# Patient Record
Sex: Male | Born: 1960 | Race: White | Hispanic: No | Marital: Married | Smoking: Never smoker
Health system: Southern US, Community
[De-identification: ages and names within clinical notes are randomized; demographics above are authoritative.]

## PROBLEM LIST (undated history)

## (undated) ENCOUNTER — Emergency Department (HOSPITAL_COMMUNITY): Disposition: A | Discharge status: Home / Self Care | Payer: Self-pay

## (undated) DIAGNOSIS — I639 Cerebral infarction, unspecified: Secondary | ICD-10-CM

## (undated) DIAGNOSIS — N200 Calculus of kidney: Secondary | ICD-10-CM

## (undated) DIAGNOSIS — K219 Gastro-esophageal reflux disease without esophagitis: Secondary | ICD-10-CM

## (undated) DIAGNOSIS — I10 Essential (primary) hypertension: Secondary | ICD-10-CM

## (undated) DIAGNOSIS — E785 Hyperlipidemia, unspecified: Secondary | ICD-10-CM

## (undated) HISTORY — DX: Hyperlipidemia, unspecified: E78.5

## (undated) HISTORY — DX: Essential (primary) hypertension: I10

## (undated) HISTORY — DX: Calculus of kidney: N20.0

## (undated) HISTORY — DX: Gastro-esophageal reflux disease without esophagitis: K21.9

---

## 1997-09-11 ENCOUNTER — Emergency Department (HOSPITAL_COMMUNITY): Admission: EM | Admit: 1997-09-11 | Discharge: 1997-09-11 | Payer: Self-pay | Admitting: Emergency Medicine

## 2000-10-02 ENCOUNTER — Emergency Department (HOSPITAL_COMMUNITY): Admission: EM | Admit: 2000-10-02 | Discharge: 2000-10-02 | Payer: Self-pay | Admitting: Emergency Medicine

## 2000-10-02 ENCOUNTER — Encounter: Payer: Self-pay | Admitting: Emergency Medicine

## 2003-03-01 HISTORY — PX: INGUINAL HERNIA REPAIR: SUR1180

## 2003-06-23 ENCOUNTER — Ambulatory Visit (HOSPITAL_COMMUNITY): Admission: RE | Admit: 2003-06-23 | Discharge: 2003-06-23 | Payer: Self-pay

## 2008-02-29 HISTORY — PX: CHOLECYSTECTOMY: SHX55

## 2009-05-07 ENCOUNTER — Emergency Department (HOSPITAL_COMMUNITY): Admission: EM | Admit: 2009-05-07 | Discharge: 2009-05-07 | Payer: Self-pay | Admitting: Emergency Medicine

## 2010-05-18 ENCOUNTER — Encounter (HOSPITAL_COMMUNITY)
Admission: RE | Admit: 2010-05-18 | Discharge: 2010-05-18 | Disposition: A | Discharge status: Home / Self Care | Payer: 59 | Source: Ambulatory Visit | Attending: Surgery | Admitting: Surgery

## 2010-05-18 ENCOUNTER — Ambulatory Visit (HOSPITAL_COMMUNITY)
Admission: RE | Admit: 2010-05-18 | Discharge: 2010-05-18 | Disposition: A | Discharge status: Home / Self Care | Payer: 59 | Source: Ambulatory Visit | Attending: Surgery | Admitting: Surgery

## 2010-05-18 ENCOUNTER — Other Ambulatory Visit (HOSPITAL_COMMUNITY): Payer: 59

## 2010-05-18 ENCOUNTER — Other Ambulatory Visit (HOSPITAL_COMMUNITY): Payer: Self-pay | Admitting: Surgery

## 2010-05-18 DIAGNOSIS — Z01810 Encounter for preprocedural cardiovascular examination: Secondary | ICD-10-CM | POA: Insufficient documentation

## 2010-05-18 DIAGNOSIS — Z01818 Encounter for other preprocedural examination: Secondary | ICD-10-CM | POA: Insufficient documentation

## 2010-05-18 DIAGNOSIS — K8000 Calculus of gallbladder with acute cholecystitis without obstruction: Secondary | ICD-10-CM | POA: Insufficient documentation

## 2010-05-18 DIAGNOSIS — Z01812 Encounter for preprocedural laboratory examination: Secondary | ICD-10-CM | POA: Insufficient documentation

## 2010-05-18 LAB — COMPREHENSIVE METABOLIC PANEL
ALT: 34 U/L (ref 0–53)
AST: 30 U/L (ref 0–37)
Albumin: 3.9 g/dL (ref 3.5–5.2)
Alkaline Phosphatase: 99 U/L (ref 39–117)
BUN: 13 mg/dL (ref 6–23)
CO2: 29 mEq/L (ref 19–32)
Calcium: 9.5 mg/dL (ref 8.4–10.5)
Chloride: 103 mEq/L (ref 96–112)
Creatinine, Ser: 0.87 mg/dL (ref 0.4–1.5)
GFR calc Af Amer: >60 mL/min (ref >60)
GFR calc non Af Amer: >60 mL/min (ref >60)
Glucose, Bld: 114 mg/dL — ABNORMAL HIGH (ref 70–99)
Potassium: 3.6 mEq/L (ref 3.5–5.1)
Sodium: 140 mEq/L (ref 135–145)
Total Bilirubin: 1 mg/dL (ref 0.3–1.2)
Total Protein: 7.4 g/dL (ref 6.0–8.3)

## 2010-05-18 LAB — CBC
HCT: 42.3 % (ref 39.0–52.0)
Hemoglobin: 15.1 g/dL (ref 13.0–17.0)
MCH: 28.8 pg (ref 26.0–34.0)
MCHC: 35.7 g/dL (ref 30.0–36.0)
MCV: 80.6 fL (ref 78.0–100.0)
Platelets: 250 10*3/uL (ref 150–400)
RBC: 5.25 MIL/uL (ref 4.22–5.81)
RDW: 12.4 % (ref 11.5–15.5)
WBC: 10.3 10*3/uL (ref 4.0–10.5)

## 2010-05-18 LAB — SURGICAL PCR SCREEN
MRSA, PCR: NEGATIVE
Staphylococcus aureus: POSITIVE — AB

## 2010-05-23 LAB — URINALYSIS, ROUTINE W REFLEX MICROSCOPIC
Nitrite: NEGATIVE
Specific Gravity, Urine: 1.022 (ref 1.005–1.030)
Urobilinogen, UA: 0.2 mg/dL (ref 0.0–1.0)
pH: 6 (ref 5.0–8.0)

## 2010-05-23 LAB — URINE CULTURE

## 2010-05-26 ENCOUNTER — Other Ambulatory Visit: Payer: Self-pay | Admitting: Surgery

## 2010-05-26 ENCOUNTER — Ambulatory Visit (HOSPITAL_COMMUNITY)
Admission: RE | Admit: 2010-05-26 | Discharge: 2010-05-26 | Disposition: A | Discharge status: Home / Self Care | Payer: 59 | Source: Ambulatory Visit | Attending: Surgery | Admitting: Surgery

## 2010-05-26 DIAGNOSIS — Z01818 Encounter for other preprocedural examination: Secondary | ICD-10-CM | POA: Insufficient documentation

## 2010-05-26 DIAGNOSIS — K8010 Calculus of gallbladder with chronic cholecystitis without obstruction: Secondary | ICD-10-CM | POA: Insufficient documentation

## 2010-05-26 DIAGNOSIS — Z01810 Encounter for preprocedural cardiovascular examination: Secondary | ICD-10-CM | POA: Insufficient documentation

## 2010-05-26 DIAGNOSIS — I10 Essential (primary) hypertension: Secondary | ICD-10-CM | POA: Insufficient documentation

## 2010-05-26 DIAGNOSIS — Z01812 Encounter for preprocedural laboratory examination: Secondary | ICD-10-CM | POA: Insufficient documentation

## 2010-05-27 NOTE — Op Note (Signed)
[name], Shawn Schwartz                  ACCOUNT NO.:  [id number]  MEDICAL RECORD NO.:  [id number]           PATIENT TYPE:  O  LOCATION:  SDSC                         FACILITY:  [facility]  PHYSICIAN:  Abigail Miyamoto, M.D. DATE OF BIRTH:  [birth year]  DATE OF PROCEDURE:  05/26/2010 DATE OF DISCHARGE:  05/26/2010                              OPERATIVE REPORT   PREOPERATIVE DIAGNOSIS:  Symptomatic cholelithiasis.  POSTOPERATIVE DIAGNOSIS:  Symptomatic cholelithiasis.  PROCEDURE:  Laparoscopic cholecystectomy.  SURGEON:  Abigail Miyamoto, MD.  ANESTHESIA:  General and 0.5% Marcaine.  ESTIMATED BLOOD LOSS:  Minimal.  FINDINGS:  The patient was found to have chronically scarred-appearing gallbladder with gallstones.  PROCEDURE IN DETAIL:  The patient was brought to the operating room, identified as Insurance account manager.  He was placed supine on the operating table and general anesthesia was induced.  His abdomen was then prepped and draped in usual sterile fashion.  Using #15 blade, a small vertical incision was made below the umbilicus.  This was carried down to the fascia, which was then opened with a scalpel.  A hemostat was then used to pass through  the peritoneal cavity under direct vision.  Next, a 0 Vicryl pursestring suture was placed around the fascial opening.  The Hasson port was placed through the opening and insufflation of the abdomen was begun.  A 5-mm port was then placed in the patient's epigastrium and two more in the right upper quadrant, all under direct vision.  The gallbladder was then grasped and retracted above the liver bed.  The cystic duct was then easily dissected out of the base of the gallbladder.  A critical window was achieved around it.  It was then clipped 3 times proximally, once distally, and transected.  The cystic artery was then identified and clipped proximally, distally, and transected as well as the posterior branch.  The gallbladder was then slowly  dissected free from liver bed with the electrocautery.  During the dissection, the gallbladder was injured and some bile leak down, but no stones.  Once the gallbladder was free from the liver bed, it was placed in Endosac and removed the incision at the umbilicus.  Hemostasis was achieved in the liver bed with the electrocautery.  The 0 Vicryl umbilicus was tied in place closing the fascial defect.  The abdomen was then copiously irrigated with normal saline.  Again, hemostasis appeared to be achieved.  All ports were removed under direct vision.  The abdomen was deflated.  I placed another figure-of-eight 0 Vicryl suture at the umbilicus to help secure the fascial defect.  I then anesthetized all incisions with Marcaine and then closed with 4-0 Monocryl subcuticular sutures.  Steri-Strips and Band-Aids were applied.  The patient tolerated the procedure well.  All sponge, needle, and instrument counts were correct at the end of procedure.  The patient was then extubated in the operating room and taken in a stable condition to the recovery room.     Abigail Miyamoto, M.D.     DB/MEDQ  D:  05/26/2010  T:  05/27/2010  Job:  [id number]  Electronically Signed by  Abigail Miyamoto M.D. on 05/27/2010 09:35:02 AM

## 2010-07-02 ENCOUNTER — Encounter (INDEPENDENT_AMBULATORY_CARE_PROVIDER_SITE_OTHER): Payer: Self-pay | Admitting: Surgery

## 2010-07-16 NOTE — Op Note (Signed)
NAME:  Shawn Schwartz, Shawn Schwartz                            ACCOUNT NO.:  [id number]   MEDICAL RECORD NO.:  [id number]                   PATIENT TYPE:  AMB   LOCATION:  DAY                                  FACILITY:  [facility]   PHYSICIAN:  Lorre Munroe., M.D.            DATE OF BIRTH:  04-11-1960   DATE OF PROCEDURE:  06/23/2003  DATE OF DISCHARGE:                                 OPERATIVE REPORT   PREOPERATIVE DIAGNOSIS:  Indirect left inguinal hernia.   POSTOPERATIVE DIAGNOSIS:  Indirect left inguinal hernia.   OPERATION:  Repair of left inguinal hernia.   SURGEON:  Lebron Conners, M.D.   ANESTHESIA:  Local with sedation.   DESCRIPTION OF PROCEDURE:  After the patient was monitored and sedated and  had routine preparation and draping of the left lower quadrant of the  abdomen and the groin area, I liberally infused local anesthetic in the  field and then subsequently in the deeper tissues as the dissection  proceeded.  I made a slightly oblique incision beginning just at the pubic  tubercle and going laterally about 6 cm in length.  I dissected down through  the fat until I identified the external oblique and opened it in the  direction of its fibers into the external ring taking care to avoid injury  to the ilioinguinal nerve.  I then carefully separated the spermatic cord  from the underlying inguinal floor and controlled it with a Penrose drain  and dissected the cremaster up to the deep ring.  The medial inguinal floor  appeared to be strong.  I then made a longitudinal cut on the proximal part  of the spermatic cord and identified a large indirect hernia sac which I  dissected free of the cord vessels and the vas deferens.  I reduced that  through the deep ring and held it in with a small patch of polypropylene  mesh sewn in with a single stitch of 2-0 silk placed in the internal oblique  fascia.  I then fashioned a patch of polypropylene mesh to fit the inguinal  floor and laid it  in place and made a slit in it to allow the exit of the  spermatic cord into the scrotum.  I then sewed that in with a running 2-0  Prolene suture beginning at the pubic tubercle and sewing medially and  superiorly with the basting stitch in the fascia of the internal oblique and  laterally and inferiorly with a running stitch in the shelving edge of the  inguinal ligament.  With a single stitch lateral to the cord and deep ring  to finish the repair.  Hemostasis was excellent.  There was a fairly large  piece of fat in the spermatic cord separate from the hernia sac that seemed  to be bulging up, so I excised that got hemostasis.  I then closed the  external oblique and subcutaneous tissues  with separate running layers of 3-  0 Vicryl and I closed the skin with intracuticular 4-0 Vicryl and Steri-  Strips.  Sponge, needle, and instrument counts were correct.  The patient  tolerated the operation well.                                               Lorre Munroe., M.D.    Jodi Marble  D:  06/23/2003  T:  06/23/2003  Job:  [id number]

## 2010-08-13 ENCOUNTER — Ambulatory Visit (INDEPENDENT_AMBULATORY_CARE_PROVIDER_SITE_OTHER): Payer: 59

## 2010-08-13 ENCOUNTER — Inpatient Hospital Stay (INDEPENDENT_AMBULATORY_CARE_PROVIDER_SITE_OTHER)
Admission: RE | Admit: 2010-08-13 | Discharge: 2010-08-13 | Disposition: A | Discharge status: Home / Self Care | Payer: 59 | Source: Ambulatory Visit | Attending: Emergency Medicine | Admitting: Emergency Medicine

## 2010-08-13 DIAGNOSIS — M722 Plantar fascial fibromatosis: Secondary | ICD-10-CM

## 2010-08-13 DIAGNOSIS — L02619 Cutaneous abscess of unspecified foot: Secondary | ICD-10-CM

## 2010-08-13 DIAGNOSIS — L03119 Cellulitis of unspecified part of limb: Secondary | ICD-10-CM

## 2011-09-13 ENCOUNTER — Encounter: Payer: Self-pay | Admitting: Family Medicine

## 2011-09-13 ENCOUNTER — Ambulatory Visit (INDEPENDENT_AMBULATORY_CARE_PROVIDER_SITE_OTHER): Payer: 59 | Admitting: Family Medicine

## 2011-09-13 VITALS — BP 156/100 | HR 71 | Ht 72.0 in | Wt 229.0 lb

## 2011-09-13 DIAGNOSIS — E785 Hyperlipidemia, unspecified: Secondary | ICD-10-CM | POA: Insufficient documentation

## 2011-09-13 DIAGNOSIS — I10 Essential (primary) hypertension: Secondary | ICD-10-CM

## 2011-09-13 DIAGNOSIS — K219 Gastro-esophageal reflux disease without esophagitis: Secondary | ICD-10-CM | POA: Insufficient documentation

## 2011-09-13 DIAGNOSIS — E119 Type 2 diabetes mellitus without complications: Secondary | ICD-10-CM

## 2011-09-13 LAB — BASIC METABOLIC PANEL
BUN: 10 mg/dL (ref 6–23)
CO2: 28 mEq/L (ref 19–32)
Chloride: 107 mEq/L (ref 96–112)
Creat: 0.97 mg/dL (ref 0.50–1.35)
Glucose, Bld: 106 mg/dL — ABNORMAL HIGH (ref 70–99)

## 2011-09-13 NOTE — Assessment & Plan Note (Addendum)
A: Pt currently on lovastatin 20, taking "sporadically." No lipid profile in EPIC. Last CBC in EPIC dated March 2012 was unremarkable.  P: Will likely schedule a fasting lipid panel at next visit and plan for LFT monitoring at that time. Continue lovastatin for now.

## 2011-09-13 NOTE — Progress Notes (Signed)
  Subjective:    Patient ID: Shawn Schwartz, male    DOB: Nov 19, 1960, 51 y.o.   MRN: [id number]  HPI: Pt presents to clinic today to establish care without complaints. Previously seen at [facility]. Very pleasant gentleman who provides history, himself. Son "Venicci" present with him, today. Long-term medical conditions as below:  HTN - Pt has previously been prescribed amlodipine and HCTZ but admits to taking them only "sporadically." Denies headache, dizziness, changes in vision. States he has been trying to eat more healthy with less fried and salty food so he can possibly take fewer drugs. Last took his meds >48 hours ago. States that his BP is up today due to difficulty finding clinic and being stressed, "running around," etc.  DM type II - Per pt, "controlled with diet, never needed medicines." States that he has a brother with DM but otherwise no family history of it. Denies numbness/tingling/weakness in any extremities.  Hyperlipidemia - Pt has previously been prescribed lovastatin, also taking sporadically. Denies muscle pain/weakness, but has seen occasional "single sore reddish spots" that have been better since he adjusted his diet. Unsure of the last time his lipids were checked, if ever.  GERD - Pt with occasional symptoms of bloating and heartburn. Pt has taken Prilosec in the past but "did not like it," and has since made diet adjustments. No current symptoms "in recent memory."  FH: Father deceased of leukemia. Mother deceased of MI, ~40 years ago. Currently married, three children. SH: Never smoker. Reports 1-3 beers per month. Lives with wife, three children in [place]. 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 Hx: Cholecystectomy "1-2 years ago," hernia repair in 2005  Review of Systems Denies chest pain, shortness of breath Denies fever/chills Denies easy bleeding/bruising Denies change in bowel/urine habits Denies blood in stool/urine Denies rash,  itching Denies headache, numbness, tingling     Objective:   Physical Exam BP 156/100  Pulse 71  Ht 6' (1.829 m)  Wt 229 lb (103.874 kg)  BMI 31.06 kg/m2 BP recheck: 140/98 Gen: overweight male, appears stated age, very pleasant in conversation Pulm: CTAB, no wheezes Cardio: RRR, no murmur, distal pulses intact Abd: soft, obese, nontender/nondistended Ext: moves extremities equally, gait normal     Assessment & Plan:

## 2011-09-13 NOTE — Assessment & Plan Note (Signed)
A: No current complaints. Pt previously has taken Prilosec over-the-counter. P: No current plans for treatment. May consider PPI long-term if pt develops symptoms.

## 2011-09-13 NOTE — Assessment & Plan Note (Signed)
A: Pt currently prescribed amlodipine and HCTZ from previous provider. Not taken for >48h. BP elevated today. P: Pt instructed to stay off current meds for two weeks and to check BP every other day to bring back to clinic next visit. Will address plans for long-term goals and meds at that time.

## 2011-09-13 NOTE — Patient Instructions (Signed)
Thank you for coming to our clinic! I look forward to working with you.  High blood pressure - Your blood pressure is high today, but you haven't had meds for 48 hours. Stop taking your amlodipine and your hydrochlorothiazide for the next two weeks, but I want you to take your blood pressure every other day at different times during the day. Write these measurements down and bring them back when you come to clinic next time.  Hyperlipidemia - Continue taking your statin (lovastatin) daily. For your next visit, we will probably schedule a time for you to come in fasting to check your lipids.  Diabetes - I will draw blood today to check basic chemistries in your blood and to get what is called an "A1c." This gives [place] an idea of what your blood sugars have been doing over the past 3 months. The basic labs will give [place] a baseline, as well.  If you have any of the following symptoms, call the clinic or go to the emergency room immediately! Crushing chest pain or tightness especially that spreads to your jaw or arm. Shortness of breath especially if made worse with activity Fever over 100.3 F not relieved by Tylenol. Nausea and/or vomiting that does not resolve on its own.

## 2011-09-13 NOTE — Assessment & Plan Note (Signed)
A: Per pt history. Value in EPIC shows 15.2 in March 2012. P: Will check BMP and A1c today, as well as a urine microalbumin. Plan to follow up in 2 weeks to talk about plans for the future, meds vs diet/exercise, etc.

## 2011-09-14 LAB — MICROALBUMIN / CREATININE URINE RATIO: Microalb Creat Ratio: 18.2 mg/g (ref 0.0–30.0)

## 2011-10-14 ENCOUNTER — Ambulatory Visit: Payer: 59 | Admitting: Family Medicine

## 2011-11-01 ENCOUNTER — Ambulatory Visit (INDEPENDENT_AMBULATORY_CARE_PROVIDER_SITE_OTHER): Payer: 59 | Admitting: Family Medicine

## 2011-11-01 ENCOUNTER — Encounter: Payer: Self-pay | Admitting: Family Medicine

## 2011-11-01 VITALS — BP 158/101 | HR 60 | Ht 72.0 in | Wt 227.0 lb

## 2011-11-01 DIAGNOSIS — I10 Essential (primary) hypertension: Secondary | ICD-10-CM

## 2011-11-01 DIAGNOSIS — M25539 Pain in unspecified wrist: Secondary | ICD-10-CM

## 2011-11-01 DIAGNOSIS — E785 Hyperlipidemia, unspecified: Secondary | ICD-10-CM

## 2011-11-01 DIAGNOSIS — M25531 Pain in right wrist: Secondary | ICD-10-CM

## 2011-11-01 DIAGNOSIS — E119 Type 2 diabetes mellitus without complications: Secondary | ICD-10-CM

## 2011-11-01 MED ORDER — LISINOPRIL 10 MG PO TABS: 10.0000 mg | ORAL_TABLET | Freq: Every day | ORAL | Status: DC

## 2011-11-01 NOTE — Patient Instructions (Signed)
Thanks for coming back to see me, today! Here is a summary of what we talked about.  Wrist pain - Most likely you sprained your wrist playing with Venicci, but since it has been five weeks, we would like you to get an xray. Whether there is a fracture or not, I will call you to schedule an appropriate follow-up visit and/or refer you to physical therapy. In the meantime, please get a brace like Dr. Denyse Amass described as it should help better than the one you have. Otherwise, you can use ibuprofen as needed for the pain and/or try ice (15 minutes on, 15 minutes off).  High blood pressure - This is still elevated, so I am going to prescribe you a new medication, lisinopril. This should also help protect your kidneys, since you have diabetes. In two weeks, I would like you to schedule a lab visit to have a set of electrolytes checked. At the same time, we can check your lipids. Please schedule this visit for in the morning, and the night before, do not eat or drink anything other than water after midnight.  Diabetes - Keep doing what you're doing with diet and exercise! We will check another A1c in about three months.

## 2011-11-01 NOTE — Assessment & Plan Note (Signed)
A: No record of lipid panel draw. Currently taking lovastatin without apparent side effects. P: Pt to schedule fasting lab draw for CMP and lipid panel. Will follow up with results.

## 2011-11-01 NOTE — Progress Notes (Signed)
  Subjective:    Patient ID: Shawn Schwartz, male    DOB: [birth year], 52 y.o.   MRN: [id number]  HPI: Pt is a 51yo with PMH of HTN, HLD, DM type II controlled with diet who presents for follow-up and wrist pain.  Right wrist pain: Ongoing for about 5 weeks; playing with son (who enjoys wrestling), had his wrist sharply bent back when trying to catch son who had jumped off a dresser. Since then, has been having daily pain in/around wrist. Occasionally takes ibuprofen with some relief. Pain worse with movement of wrist, but sometimes will be burning in character, when wrist is at rest. Pt bought a cloth wrist brace without plastic support, which has been helping minimally. Denies much swelling.  HTN: Pt currently taking amlodipine 10 mg and HCTZ 25 mg daily. 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 No visual changes/blurriness of vision. No chest pain. Very rarely will miss a dose of medication.  DM: Not currently taking or ever requiring any hypoglycemic agents. Continues good exercise and diet habits. Relieved to hear last A1c was good (6.1).  HLD: Currently taking lovastatin. No muscle aches/pains. Does not believe he has ever had lipids checked.  Review of Systems: As above. Otherwise specifically denies fever/chills, N/V/D, abdominal pain, SOB.     Objective:   Physical Exam BP 158/101  Pulse 60  Ht 6' (1.829 m)  Wt 227 lb (102.967 kg)  BMI 30.79 kg/m2 Gen: middle-aged adult male, very pleasant and cooperative with exam, in NAD Pulm: CTAB, no wheezes Cardio: RRR, no rub/murmur Abd: soft, nontender, BS+ Ext:   right wrist with significantly reduced ROM (extension/flexion) secondary to pain; grossly diffusely tender to palpation on dorsal and palmar wrist surfaces  good grip strength bilaterally, no warmth over wrist, minimal soft tissue swelling compared with left; sensation  intact  no tenderness over anatomical snuffbox, no Tinnel sign, ulnar and radial deviation without significant pain or loss of ROM  Otherwise DP 2+ and intact, symmetric, cap refill <2 seconds in bilateral upper extremities     Assessment & Plan:

## 2011-11-01 NOTE — Assessment & Plan Note (Signed)
A: Most likely a sprain but with persistent symptoms after 5 weeks, must rule out fracture. P: Pt instructed to get xray at [facility]. Otherwise instructed to get a wrist brace with plastic insert to hold wrist in neutral position. Also discussed ibuprofen, rest, ice (15 minutes on, 15 minutes off). Will likely refer to PT once x-rays are read, provided there are no fractures to refer to ortho.

## 2011-11-01 NOTE — Assessment & Plan Note (Signed)
A: BP remains elevated, 158/101 (last visit 152/100). P: Will add lisinopril 10 mg to regimen. Continue amlodipine 10 mg and HCTZ 25. Pt to get blood draw next week (CMP), will check kidney function at that time. Will follow-up in about 1 month.

## 2011-11-01 NOTE — Assessment & Plan Note (Signed)
A: Per pt history. At last visit, I documented an A1c in March 2012 of 15.2, but I cannot find that value currently; it is possible I was in another patient's chart or looking at the wrong lab value. Regardless, last A1c in July was 6.1. P: Continue diet control. Will repeat A1c in 3-6 months.

## 2011-11-03 ENCOUNTER — Ambulatory Visit
Admission: RE | Admit: 2011-11-03 | Discharge: 2011-11-03 | Disposition: A | Discharge status: Home / Self Care | Payer: 59 | Source: Ambulatory Visit | Attending: Family Medicine | Admitting: Family Medicine

## 2011-11-03 ENCOUNTER — Other Ambulatory Visit: Payer: 59

## 2011-11-03 DIAGNOSIS — M25531 Pain in right wrist: Secondary | ICD-10-CM

## 2011-11-04 ENCOUNTER — Encounter: Payer: Self-pay | Admitting: Family Medicine

## 2011-11-09 ENCOUNTER — Other Ambulatory Visit: Payer: 59

## 2011-11-09 DIAGNOSIS — E785 Hyperlipidemia, unspecified: Secondary | ICD-10-CM

## 2011-11-09 LAB — COMPREHENSIVE METABOLIC PANEL
ALT: 16 U/L (ref 0–53)
BUN: 16 mg/dL (ref 6–23)
CO2: 23 mEq/L (ref 19–32)
Calcium: 9.3 mg/dL (ref 8.4–10.5)
Chloride: 107 mEq/L (ref 96–112)
Creat: 0.98 mg/dL (ref 0.50–1.35)
Glucose, Bld: 103 mg/dL — ABNORMAL HIGH (ref 70–99)

## 2011-11-09 LAB — LIPID PANEL
Cholesterol: 162 mg/dL (ref 0–200)
Total CHOL/HDL Ratio: 4.5 Ratio

## 2011-11-09 NOTE — Addendum Note (Signed)
Addended by: Herminio Heads on: 11/09/2011 05:12 PM   Modules accepted: Orders

## 2011-11-11 ENCOUNTER — Other Ambulatory Visit: Payer: Self-pay | Admitting: Family Medicine

## 2011-11-11 DIAGNOSIS — M25531 Pain in right wrist: Secondary | ICD-10-CM

## 2011-11-30 ENCOUNTER — Encounter: Payer: 59 | Admitting: Family Medicine

## 2011-11-30 NOTE — Progress Notes (Signed)
This encounter was created in error - please disregard.

## 2011-12-22 ENCOUNTER — Ambulatory Visit: Payer: 59 | Admitting: Family Medicine

## 2012-01-02 ENCOUNTER — Ambulatory Visit: Payer: 59 | Admitting: Family Medicine

## 2012-09-10 ENCOUNTER — Ambulatory Visit: Payer: 59 | Admitting: Family Medicine

## 2012-09-27 ENCOUNTER — Ambulatory Visit (INDEPENDENT_AMBULATORY_CARE_PROVIDER_SITE_OTHER): Payer: 59 | Admitting: Family Medicine

## 2012-09-27 ENCOUNTER — Encounter: Payer: Self-pay | Admitting: Family Medicine

## 2012-09-27 VITALS — BP 133/87 | HR 94 | Temp 98.2°F | Ht 72.0 in | Wt 226.0 lb

## 2012-09-27 DIAGNOSIS — K219 Gastro-esophageal reflux disease without esophagitis: Secondary | ICD-10-CM

## 2012-09-27 DIAGNOSIS — Z0000 Encounter for general adult medical examination without abnormal findings: Secondary | ICD-10-CM

## 2012-09-27 DIAGNOSIS — E785 Hyperlipidemia, unspecified: Secondary | ICD-10-CM

## 2012-09-27 DIAGNOSIS — I10 Essential (primary) hypertension: Secondary | ICD-10-CM

## 2012-09-27 DIAGNOSIS — E119 Type 2 diabetes mellitus without complications: Secondary | ICD-10-CM

## 2012-09-27 MED ORDER — LOVASTATIN 20 MG PO TABS: 20.0000 mg | ORAL_TABLET | Freq: Every day | ORAL | Status: DC

## 2012-09-27 MED ORDER — LISINOPRIL-HYDROCHLOROTHIAZIDE 10-12.5 MG PO TABS: 1.0000 | ORAL_TABLET | Freq: Every day | ORAL | Status: DC

## 2012-09-27 NOTE — Patient Instructions (Addendum)
Thank you for coming in, today! Everything on your exam looks okay, today. We will check your liver, kidneys, and cholesterol, today. I will refill your cholesterol medicine. I am changing your blood pressure medicine.  I'm adding a second medicine into one pill.  It will lisinopril 10 mg and HCTZ 12.5 mg.  You can take this medicine at night with your cholesterol medicine. Make a lab appointment in about 2 weeks, after you start the new blood pressure medicine to check your kidney function. I will call you or send you a letter if anything looks unusual. Make an appointment to see me in about 3 months, after that. I will make a referral to the GI doctor for a colonoscopy. Our office or theirs will call you. If you do not hear anything about an appointment in the next couple of weeks, call us. Please feel free to call with any questions or concerns at any time, at [phone]. --Dr. Casper Harrison

## 2012-09-28 LAB — LIPID PANEL
Cholesterol: 178 mg/dL (ref 0–200)
Triglycerides: 210 mg/dL — ABNORMAL HIGH (ref <150)
VLDL: 42 mg/dL — ABNORMAL HIGH (ref 0–40)

## 2012-09-28 LAB — COMPREHENSIVE METABOLIC PANEL
Albumin: 4.5 g/dL (ref 3.5–5.2)
CO2: 27 mEq/L (ref 19–32)
Glucose, Bld: 129 mg/dL — ABNORMAL HIGH (ref 70–99)
Sodium: 140 mEq/L (ref 135–145)
Total Bilirubin: 0.4 mg/dL (ref 0.3–1.2)
Total Protein: 7.5 g/dL (ref 6.0–8.3)

## 2012-10-02 ENCOUNTER — Encounter: Payer: Self-pay | Admitting: Family Medicine

## 2012-10-02 NOTE — Assessment & Plan Note (Signed)
Normal prostate exam today. Referred to GI for colonoscopy.

## 2012-10-02 NOTE — Progress Notes (Signed)
  Subjective:    Patient ID: Shawn Schwartz, male    DOB: [birth year], 52 y.o.   MRN: [id number]  HPI: Pt presents to clinic for general follow-up.   HTN - Pt reports being out of HCTZ and amlodipine, unsure of the last time he took these; misses doses "often" of his lisinopril  -pt reports some stiff neck, dizziness, and headache for a few weeks, though better the last several days; unsure if this is related to his BP  -does not check BP at home  HLD - reports being out of lovastatin "for a while," and missed doses "frequently" before he ran out  -reports no muscle aches or other side effects while taking lovastatin  DM - Pt reports still not on any medications >1 year, still having improved diet (very few sodas, no diet sodas, more baked foods, fewer sweets, etc)  GERD - typically not bad, though more frequent "flares" since gallbladder removed  -worst with large meals or fatty, spicy food, which he has generally cut out  -very rarely has sensation of choking with heartburn-like pain  -does not regularly take any medication  Health maintenance -has never had a prostate exam -has never had colonoscopy  Pt is a never smoker. In addition to the above documentation, pt's PMH, surgical history, FH, and SH all reviewed and updated where appropriate in the EMR. I have also reviewed and updated the pt's allergies and current medications as appropriate.  Review of Systems: As above. Otherwise, full 12-system ROS was reviewed and all negative. Occasionally takes ibuprofen for "minor aches and pains."     Objective:   Physical Exam BP 133/87  Pulse 94  Temp(Src) 98.2 F (36.8 C) (Oral)  Ht 6' (1.829 m)  Wt 226 lb (102.513 kg)  BMI 30.64 kg/m2 Gen: well-appearing adult male in NAD, very pleasant HEENT: /AT, sclerae/conjunctivae clear, no lid lag, EOMI, PERRLA   MMM, posterior oropharynx clear, no cervical lymphadenopathy  neck supple with full ROM, no masses appreciated; thyroid not  enlarged  Cardio: RRR, no murmur appreciated; distal pulses intact/symmetric Pulm: CTAB, no wheezes, normal WOB  Abd: soft, nondistended, BS+, no HSM GU: normal rectal tone, prostate not enlarged and smooth in contour, nontender Ext: warm/well-perfused, no cyanosis/clubbing/edema MSK: strength 5/5 in all four extremities, no frank joint deformity/effusion;   normal ROM to all four extremities with no point muscle/bony tenderness in spine Neuro/Psych: alert/oriented, sensation grossly intact; normal gait/balance  mood euthymic with congruent affect     Assessment & Plan:

## 2012-10-02 NOTE — Assessment & Plan Note (Signed)
A: Currently out of lovastatin for an uncertain amount of time, though no apparent side effects while taking it. CMP this visit unremarkable, and lipid panel shows LDL 100.  P: Refilled lovastatin and suggested taking once-a-day meds at night with statin for consistency. F/u as needed.

## 2012-10-02 NOTE — Assessment & Plan Note (Signed)
A: Mild/intermittent complaints, none consistent or regular. Not currently on PPI regularly.  P: F/u as needed. Consider regular PPI long-term in the future.

## 2012-10-02 NOTE — Assessment & Plan Note (Signed)
A: Diet-controlled. A1c 5.7 at this visit with no medications.  P: Continue current dietary habits. Repeat A1c in 3-6 months.

## 2012-10-02 NOTE — Assessment & Plan Note (Signed)
A: Mildly elevated at 133/87 though pt has not been taking meds consistently; has not been taking HCTZ or amlodipine at all, and lisinopril infrequently. Some recent headache/dizziness that pt attributes to blood pressure and noncompliance with meds.  P: Refilled lisinopril as combination pill with HCTZ. Plan to check CMP today for kidney function, then repeat BMP in two weeks, then f/u in 1 month. Suggested taking medications at night with statin for consistency.

## 2012-10-10 ENCOUNTER — Telehealth: Payer: Self-pay | Admitting: *Deleted

## 2012-10-10 NOTE — Telephone Encounter (Signed)
Pt had diabetic check up on 09/09/12 with a1c Wyatt Haste, RN-BSN

## 2012-10-17 ENCOUNTER — Ambulatory Visit: Payer: 59 | Admitting: Family Medicine

## 2012-11-22 ENCOUNTER — Encounter: Payer: Self-pay | Admitting: Family Medicine

## 2013-03-18 ENCOUNTER — Encounter: Payer: Self-pay | Admitting: Family Medicine

## 2013-03-18 ENCOUNTER — Ambulatory Visit (INDEPENDENT_AMBULATORY_CARE_PROVIDER_SITE_OTHER): Payer: 59 | Admitting: Family Medicine

## 2013-03-18 VITALS — BP 129/79 | HR 75 | Temp 98.2°F | Ht 72.0 in | Wt 224.0 lb

## 2013-03-18 DIAGNOSIS — M79609 Pain in unspecified limb: Secondary | ICD-10-CM

## 2013-03-18 DIAGNOSIS — M79671 Pain in right foot: Secondary | ICD-10-CM | POA: Insufficient documentation

## 2013-03-18 MED ORDER — COLCHICINE 0.6 MG PO TABS: ORAL_TABLET | ORAL | Status: DC

## 2013-03-18 NOTE — Patient Instructions (Signed)
There is a good possibility you have had a gout flare. If you're not better in 2-3 days please  Come back for reevaluation.  I ordered colchicine which will help the flare  The other part of the treatment are NSAIDs, you can take 2 Aleve twice a day for the next 3 or 4 days or he could take ibuprofen 600 mg every 6 hours or 800 mg every 8 hours.

## 2013-03-18 NOTE — Progress Notes (Signed)
Patient ID: Shawn Schwartz, male   DOB: February 17, 1961, 53 y.o.   MRN: [id number]  Kenn File, MD Phone: [phone]  Subjective:  Chief complaint-noted  # SDA for R foot pain.   Patient states that approximately 4 days ago he began to have right great toe pain. He previously was told that he had gout by another doctor but feels that this is another gout flare. He describes that he had a large erythematous swollen great toe. The swelling and tenderness spread throughout his foot. He denies fever, chills, sweats. He denies any recent large protein loads, dehydration, or alcohol binges.   He states that his tried ibuprofen with minimal relief. 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   He describes that his pain is similar to a toothache in character and at the erythema and swelling has started to improve slightly. He states that it hurts worse when he walks, and now his entire foot is hurting when he walks.   ROS- No fever, chills, sweats No dyspnea Chest pain Abdominal pain Positive great toe pain, right foot swelling, and erythema  Past Medical History Patient Active Problem List   Diagnosis Date Noted  . Foot pain, right 03/18/2013  . Health maintenance examination 09/27/2012  . Hyperlipidemia 09/13/2011  . Hypertension 09/13/2011  . Type 2 diabetes mellitus 09/13/2011  . GERD (gastroesophageal reflux disease) 09/13/2011    Medications- reviewed and updated Current Outpatient Prescriptions  Medication Sig Dispense Refill  . lisinopril-hydrochlorothiazide (PRINZIDE,ZESTORETIC) 10-12.5 MG per tablet Take 1 tablet by mouth daily.  90 tablet  3  . lovastatin (MEVACOR) 20 MG tablet Take 1 tablet (20 mg total) by mouth at bedtime.  90 tablet  3  . colchicine 0.6 MG tablet Take 2 pills right away and 1  additional pill 1 hour later. Take 1 pill twice daily for 5 days  13 tablet  0   No current facility-administered medications for this visit.    Objective: BP 129/79  Pulse 75  Temp(Src) 98.2 F (36.8 C) (Oral)  Ht 6' (1.829 m)  Wt 224 lb (101.606 kg)  BMI 30.37 kg/m2 Gen: NAD, alert, cooperative with exam HEENT: NCAT CV: RRR, good S1/S2, no murmur Resp: CTABL, no wheezes, non-labored Ext:  right foot and specifically right great toe grossly swollen compared to the left. 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 Discussed red flags in detail, and the possibility that this may not be gout. Advised him to return to the clinic if he's not better in 2-3 days.      No orders of the defined types were placed in this encounter.    Meds ordered this encounter  Medications  . colchicine 0.6 MG tablet    Sig: Take 2 pills right away and 1 additional pill 1 hour later. Take 1 pill twice  daily for 5 days    Dispense:  13 tablet    Refill:  0

## 2013-03-18 NOTE — Assessment & Plan Note (Signed)
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 Discussed red flags in detail, and the possibility that this may not be gout. Advised him to return to the clinic if he's not better in 2-3 days.

## 2013-03-22 ENCOUNTER — Encounter: Payer: Self-pay | Admitting: Family Medicine

## 2013-03-22 ENCOUNTER — Ambulatory Visit (INDEPENDENT_AMBULATORY_CARE_PROVIDER_SITE_OTHER): Payer: 59 | Admitting: Family Medicine

## 2013-03-22 ENCOUNTER — Ambulatory Visit (HOSPITAL_COMMUNITY)
Admission: RE | Admit: 2013-03-22 | Discharge: 2013-03-22 | Disposition: A | Discharge status: Home / Self Care | Payer: 59 | Source: Ambulatory Visit | Attending: Family Medicine | Admitting: Family Medicine

## 2013-03-22 VITALS — BP 132/89 | HR 79 | Temp 97.8°F | Ht 72.0 in | Wt 223.0 lb

## 2013-03-22 DIAGNOSIS — S99919A Unspecified injury of unspecified ankle, initial encounter: Principal | ICD-10-CM

## 2013-03-22 DIAGNOSIS — M79609 Pain in unspecified limb: Secondary | ICD-10-CM | POA: Insufficient documentation

## 2013-03-22 DIAGNOSIS — S93431A Sprain of tibiofibular ligament of right ankle, initial encounter: Principal | ICD-10-CM

## 2013-03-22 DIAGNOSIS — X500XXA Overexertion from strenuous movement or load, initial encounter: Secondary | ICD-10-CM | POA: Insufficient documentation

## 2013-03-22 DIAGNOSIS — S99929A Unspecified injury of unspecified foot, initial encounter: Principal | ICD-10-CM

## 2013-03-22 DIAGNOSIS — S93491A Sprain of other ligament of right ankle, initial encounter: Secondary | ICD-10-CM | POA: Insufficient documentation

## 2013-03-22 DIAGNOSIS — M25579 Pain in unspecified ankle and joints of unspecified foot: Secondary | ICD-10-CM | POA: Insufficient documentation

## 2013-03-22 DIAGNOSIS — S8990XA Unspecified injury of unspecified lower leg, initial encounter: Secondary | ICD-10-CM | POA: Insufficient documentation

## 2013-03-22 DIAGNOSIS — S93439A Sprain of tibiofibular ligament of unspecified ankle, initial encounter: Secondary | ICD-10-CM

## 2013-03-22 MED ORDER — TRAMADOL HCL 50 MG PO TABS: 50.0000 mg | ORAL_TABLET | Freq: Three times a day (TID) | ORAL | Status: DC | PRN

## 2013-03-22 NOTE — Patient Instructions (Signed)
Dear Shawn Schwartz, Thank you for coming in to clinic today. It was good to meet you!  Today we discussed your Right foot pain, gout , ankle sprain. 1. It sounds like the gout flare has resolved. However, we believe that you have a high ankle sprain from your initial injury. 2. I have written a prescription for a CAM Walker Boot for your right foot, this will provide support and allow you to weight bear when walking on this side. Take this to the [facility] to pick-up your boot. 3. We have ordered Xrays of your right ankle and leg, so that we can make sure that you don't have any fractures associated with your sprain. If there is evidence of any fracture, then I will refer you to Orthopedic Surgery specialist, and they will evaluate you further. However, if there are no fractures, then I can refer you to Sports Medicine to assist with your recovery.    - You will receive a call from our office with results of your Xray on Monday or Tuesday. 4. This injury may take some time to heal, and I have provided a prescription for Tramadol for your pain if needed. It will be important to take it easy this weekend, and stay off of your feet for the majority of the time. RICE therapy (Rest, Ice, Compression, and Elevation) can be helpful if swelling returns.  Please schedule a follow-up appointment with Dr. Venetia Maxon within 1-2 weeks if your pain persists, or when you are ready to return to full duty at work.  If you have any other questions or concerns, please feel free to call the clinic to contact me. You may also schedule an earlier appointment if necessary.  However, if your symptoms get significantly worse, please go to the Emergency Department to seek immediate medical attention.  Nobie Putnam, DO [facility]  RICE: Routine Care for Injuries The routine care of many injuries includes Rest, Ice, Compression, and Elevation (RICE). HOME CARE INSTRUCTIONS  Rest is  needed to allow your body to heal. Routine activities can usually be resumed when comfortable. Injured tendons and bones can take up to 6 weeks to heal. Tendons are the cord-like structures that attach muscle to bone.  Ice following an injury helps keep the swelling down and reduces pain.  Put ice in a plastic bag.  Place a towel between your skin and the bag.  Leave the ice on for 15-20 minutes, 03-04 times a day. Do this while awake, for the first 24 to 48 hours. After that, continue as directed by your caregiver.  Compression helps keep swelling down. It also gives support and helps with discomfort. If an elastic bandage has been applied, it should be removed and reapplied every 3 to 4 hours. It should not be applied tightly, but firmly enough to keep swelling down. Watch fingers or toes for swelling, bluish discoloration, coldness, numbness, or excessive pain. If any of these problems occur, remove the bandage and reapply loosely. Contact your caregiver if these problems continue.  Elevation helps reduce swelling and decreases pain. With extremities, such as the arms, hands, legs, and feet, the injured area should be placed near or above the level of the heart, if possible. SEEK IMMEDIATE MEDICAL CARE IF:  You have persistent pain and swelling.  You develop redness, numbness, or unexpected weakness.  Your symptoms are getting worse rather than improving after several days. These symptoms may indicate that further evaluation or further X-rays  are needed. Sometimes, X-rays may not show a small broken bone (fracture) until 1 week or 10 days later. Make a follow-up appointment with your caregiver. Ask when your X-ray results will be ready. Make sure you get your X-ray results. Document Released: 05/29/2000 Document Revised: 05/09/2011 Document Reviewed: 07/16/2010 [facility] Patient Information 2014 [place], [place].  Ankle Sprain An ankle sprain is an injury to the strong, fibrous tissues  (ligaments) that hold the bones of your ankle joint together.  CAUSES An ankle sprain is usually caused by a fall or by twisting your ankle. Ankle sprains most commonly occur when you step on the outer edge of your foot, and your ankle turns inward. People who participate in sports are more prone to these types of injuries.  SYMPTOMS   Pain in your ankle. The pain may be present at rest or only when you are trying to stand or walk.  Swelling.  Bruising. Bruising may develop immediately or within 1 to 2 days after your injury.  Difficulty standing or walking, particularly when turning corners or changing directions. DIAGNOSIS  Your caregiver will ask you details about your injury and perform a physical exam of your ankle to determine if you have an ankle sprain. During the physical exam, your caregiver will press on and apply pressure to specific areas of your foot and ankle. Your caregiver will try to move your ankle in certain ways. An X-ray exam may be done to be sure a bone was not broken or a ligament did not separate from one of the bones in your ankle (avulsion fracture).  TREATMENT  Certain types of braces can help stabilize your ankle. Your caregiver can make a recommendation for this. Your caregiver may recommend the use of medicine for pain. If your sprain is severe, your caregiver may refer you to a surgeon who helps to restore function to parts of your skeletal system (orthopedist) or a physical therapist. [place] ice to your injury for 1 2 days or as directed by your caregiver. Applying ice helps to reduce inflammation and pain.  Put ice in a plastic bag.  Place a towel between your skin and the bag.  Leave the ice on for 15-20 minutes at a time, every 2 hours while you are awake.  Only take over-the-counter or prescription medicines for pain, discomfort, or fever as directed by your caregiver.  Elevate your injured ankle above the level of your heart  as much as possible for 2 3 days.  If your caregiver recommends crutches, use them as instructed. Gradually put weight on the affected ankle. Continue to use crutches or a cane until you can walk without feeling pain in your ankle.  If you have a plaster splint, wear the splint as directed by your caregiver. Do not rest it on anything harder than a pillow for the first 24 hours. Do not put weight on it. Do not get it wet. You may take it off to take a shower or bath.  You may have been given an elastic bandage to wear around your ankle to provide support. If the elastic bandage is too tight (you have numbness or tingling in your foot or your foot becomes cold and blue), adjust the bandage to make it comfortable.  If you have an air splint, you may blow more air into it or let air out to make it more comfortable. You may take your splint off at night and before taking a shower or  bath. Wiggle your toes in the splint several times per day to decrease swelling. SEEK MEDICAL CARE IF:   You have rapidly increasing bruising or swelling.  Your toes feel extremely cold or you lose feeling in your foot.  Your pain is not relieved with medicine. SEEK IMMEDIATE MEDICAL CARE IF:  Your toes are numb or blue.  You have severe pain that is increasing. MAKE SURE YOU:   Understand these instructions.  Will watch your condition.  Will get help right away if you are not doing well or get worse. Document Released: 02/14/2005 Document Revised: 11/09/2011 Document Reviewed: 02/26/2011 [facility] Patient Information 2014 [place], [place].

## 2013-03-22 NOTE — Progress Notes (Signed)
Subjective:     Patient ID: Shawn Schwartz, male   DOB: Mar 16, 1960, 53 y.o.   MRN: [id number]  Patient presents for a same day visit.  HPI  RIGHT FOOT PAIN / h/o GOUT FLARE: Reports chronic h/o gout (last time in R-toe 2-3 yrs), not on any gout preventative medicine - Recent episode, unclear of provoking factor, symptoms started around 03/14/13, Right foot pain mostly localized to R-great toe / metarsals / heel, described similar pain to previous gout flare, associated with swelling / redness, "difficulty getting his boot on". Also reported twisting his R-ankle while limping because of the pain, continued to work, which believes increased his pain. - Last seen in clinic 03/18/13, swollen foot w/o redness, empirically treated for gout with colchicine, ibuprofen PRN. Advised to follow-up if not improved, concern may not be gout flare - Currently presents with recurrent swelling in Right foot / ankle, worse at end of day, improves with rest and elevation. 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     Objective:   Physical Exam  BP 132/89  Pulse 79  Temp(Src) 97.8 F (36.6 C) (Oral)  Ht 6' (1.829 m)  Wt 223 lb (101.152 kg)  BMI 30.24 kg/m2  Gen - pleasant, well-appearing, NAD HEENT - MMM Heart - RRR, no murmurs heard Lungs - CTAB, no wheezing, crackles, or rhonchi. Normal work of breathing. MSK - R-foot: no edema, +tender to touch dorsal lateral > medial aspects (ATF ligament), forefoot mobilization without inc tenderness, talar tilt test intact without instability, intact +2 dorsalis pedis / posterior tib. L-foot: non-tender, stable, intact pulses. R-Leg: +tenderness compression tib / fib Skin - warm, dry, no rashes, no evidence of ulcers b/l lower ext Neuro - awake, alert, oriented, grossly non-focal, intact muscle strength 5/5 b/l, intact distal sensation to light touch, intact distal LE proprioception, antalgic gait (difficulty weight bearing)      Assessment:     See specific A&P problem list for details.      Plan:     See specific A&P problem list for details.

## 2013-03-22 NOTE — Assessment & Plan Note (Addendum)
Suspect gout flare improved / resolved with colchicine. Current R-foot / LE pain clinically consistent with unhealed inversion high-ankle sprain (+tenderness lateral aspect over ATF, tib-fib compression), suspect syndesmotic sprain, possibly Grade II. Likely exacerbated by attempting to ambulate without adequate support. Concern for occult fracture, potential proximal fibula fracture - Unable to return to work (dependent on ability to walk long distances)  Plan: 1. Ordered X-rays at [facility]-radiology complete R-ankle series, R-tibia-fibula, eval for occult fracture. Reviewed images / results and both studies were normal without evidence of fracture. 2. Written rx for CAM [facility], right - for support, may need to wear if returns to work on light duty, expect to wear for 1-2 weeks 3. Start Tramadol 50mg  q8 hr PRN pain (#30, 0 refill) 4. Advised relative rest this weekend, RICE therapy 5. Written note to hold patient out of work indefinitely until cleared by physician to return to light vs full duty, recommend re-evaluation in within 1-2 weeks. 6. Referral to Sports Medicine to assist with management / rehab, given significant acute functional impairment related to patient's occupation

## 2013-03-25 ENCOUNTER — Telehealth: Payer: Self-pay | Admitting: *Deleted

## 2013-03-25 NOTE — Telephone Encounter (Signed)
  lmvm for pt to call back.  Please give patient message below from MD.  Lazaro Arms, CMA

## 2013-03-25 NOTE — Telephone Encounter (Signed)
Message copied by Lazaro Arms on Mon Mar 25, 2013  8:49 AM ------      Message from: Olin Hauser      Created: Fri Mar 22, 2013 10:14 PM      Regarding: Call with results       Hi Red Team,            I've already reviewed Mr. Xylan Sheils results (Right ankle / tib-fib) done after clinic (03/22/13) and they are normal, negative for fractures. As we discussed, since there are no fractures, I've submitted a referral to Sports Medicine for continued acute management of his ankle sprain.            If you could please call him with these results, and let him know that he is to follow-up with Sports Medicine in 1-2 weeks, and he should also schedule [facility] f/u in within 1-2 weeks when he is ready to return to work on light duty.            Thanks!      Nobie Putnam, [place], PGY-1       ------

## 2013-04-01 ENCOUNTER — Encounter: Payer: Self-pay | Admitting: Family Medicine

## 2013-04-01 ENCOUNTER — Ambulatory Visit (INDEPENDENT_AMBULATORY_CARE_PROVIDER_SITE_OTHER): Payer: 59 | Admitting: Family Medicine

## 2013-04-01 VITALS — BP 178/106 | HR 93 | Ht 72.0 in | Wt 223.0 lb

## 2013-04-01 DIAGNOSIS — S93431A Sprain of tibiofibular ligament of right ankle, initial encounter: Secondary | ICD-10-CM

## 2013-04-01 DIAGNOSIS — S93409A Sprain of unspecified ligament of unspecified ankle, initial encounter: Secondary | ICD-10-CM

## 2013-04-01 DIAGNOSIS — S93491A Sprain of other ligament of right ankle, initial encounter: Secondary | ICD-10-CM

## 2013-04-01 DIAGNOSIS — S93439A Sprain of tibiofibular ligament of unspecified ankle, initial encounter: Secondary | ICD-10-CM

## 2013-04-01 DIAGNOSIS — S93401A Sprain of unspecified ligament of right ankle, initial encounter: Secondary | ICD-10-CM

## 2013-04-01 NOTE — Patient Instructions (Signed)
You have an ankle sprain. Ice the area for 15 minutes at a time, 3-4 times a day Aleve 2 tabs twice a day with food OR ibuprofen 3 tabs three times a day with food for pain and inflammation. Elevate above the level of your heart when possible Crutches if needed to help with walking Bear weight when tolerated Use laceup ankle brace to help with stability while you recover from this injury. Come out of the boot/brace twice a day to do Up/down and alphabet exercises 2-3 sets of each. Start physical therapy for strengthening and balance exercises. If not improving as expected, we may repeat x-rays or consider further testing like an MRI. Follow up with me in 4 weeks. 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

## 2013-04-02 ENCOUNTER — Encounter: Payer: Self-pay | Admitting: Family Medicine

## 2013-04-02 ENCOUNTER — Ambulatory Visit (INDEPENDENT_AMBULATORY_CARE_PROVIDER_SITE_OTHER): Payer: 59 | Admitting: Family Medicine

## 2013-04-02 VITALS — BP 168/110 | HR 98 | Ht 72.0 in | Wt 230.9 lb

## 2013-04-02 DIAGNOSIS — S93491A Sprain of other ligament of right ankle, initial encounter: Secondary | ICD-10-CM

## 2013-04-02 DIAGNOSIS — I10 Essential (primary) hypertension: Secondary | ICD-10-CM

## 2013-04-02 DIAGNOSIS — M722 Plantar fascial fibromatosis: Secondary | ICD-10-CM

## 2013-04-02 DIAGNOSIS — S93431A Sprain of tibiofibular ligament of right ankle, initial encounter: Secondary | ICD-10-CM

## 2013-04-02 DIAGNOSIS — S93439A Sprain of tibiofibular ligament of unspecified ankle, initial encounter: Secondary | ICD-10-CM

## 2013-04-02 NOTE — Patient Instructions (Signed)
Ankle Sprain An ankle sprain is an injury to the strong, fibrous tissues (ligaments) that hold the bones of your ankle joint together.  CAUSES An ankle sprain is usually caused by a fall or by twisting your ankle. Ankle sprains most commonly occur when you step on the outer edge of your foot, and your ankle turns inward. People who participate in sports are more prone to these types of injuries.  SYMPTOMS   Pain in your ankle. The pain may be present at rest or only when you are trying to stand or walk.  Swelling.  Bruising. Bruising may develop immediately or within 1 to 2 days after your injury.  Difficulty standing or walking, particularly when turning corners or changing directions. DIAGNOSIS  Your caregiver will ask you details about your injury and perform a physical exam of your ankle to determine if you have an ankle sprain. During the physical exam, your caregiver will press on and apply pressure to specific areas of your foot and ankle. Your caregiver will try to move your ankle in certain ways. An X-ray exam may be done to be sure a bone was not broken or a ligament did not separate from one of the bones in your ankle (avulsion fracture).  TREATMENT  Certain types of braces can help stabilize your ankle. Your caregiver can make a recommendation for this. Your caregiver may recommend the use of medicine for pain. If your sprain is severe, your caregiver may refer you to a surgeon who helps to restore function to parts of your skeletal system (orthopedist) or a physical therapist. HOME CARE INSTRUCTIONS   Apply ice to your injury for 1 2 days or as directed by your caregiver. Applying ice helps to reduce inflammation and pain.  Put ice in a plastic bag.  Place a towel between your skin and the bag.  Leave the ice on for 15-20 minutes at a time, every 2 hours while you are awake.  Only take over-the-counter or prescription medicines for pain, discomfort, or fever as directed by  your caregiver.  Elevate your injured ankle above the level of your heart as much as possible for 2 3 days.  If your caregiver recommends crutches, use them as instructed. Gradually put weight on the affected ankle. Continue to use crutches or a cane until you can walk without feeling pain in your ankle.  If you have a plaster splint, wear the splint as directed by your caregiver. Do not rest it on anything harder than a pillow for the first 24 hours. Do not put weight on it. Do not get it wet. You may take it off to take a shower or bath.  You may have been given an elastic bandage to wear around your ankle to provide support. If the elastic bandage is too tight (you have numbness or tingling in your foot or your foot becomes cold and blue), adjust the bandage to make it comfortable.  If you have an air splint, you may blow more air into it or let air out to make it more comfortable. You may take your splint off at night and before taking a shower or bath. Wiggle your toes in the splint several times per day to decrease swelling. SEEK MEDICAL CARE IF:   You have rapidly increasing bruising or swelling.  Your toes feel extremely cold or you lose feeling in your foot.  Your pain is not relieved with medicine. SEEK IMMEDIATE MEDICAL CARE IF:  Your toes are numb   or blue.  You have severe pain that is increasing. MAKE SURE YOU:   Understand these instructions.  Will watch your condition.  Will get help right away if you are not doing well or get worse. Document Released: 02/14/2005 Document Revised: 11/09/2011 Document Reviewed: 02/26/2011 [facility] Patient Information 2014 [place], [place].   Plantar Fasciitis Plantar fasciitis is a common condition that causes foot pain. It is soreness (inflammation) of the band of tough fibrous tissue on the bottom of the foot that runs from the heel bone (calcaneus) to the ball of the foot. The cause of this soreness may be from excessive standing,  poor fitting shoes, running on hard surfaces, being overweight, having an abnormal walk, or overuse (this is common in runners) of the painful foot or feet. It is also common in aerobic exercise dancers and ballet dancers. SYMPTOMS  Most people with plantar fasciitis complain of:  Severe pain in the morning on the bottom of their foot especially when taking the first steps out of bed. This pain recedes after a few minutes of walking.  Severe pain is experienced also during walking following a long period of inactivity.  Pain is worse when walking barefoot or up stairs DIAGNOSIS   Your caregiver will diagnose this condition by examining and feeling your foot.  Special tests such as X-rays of your foot, are usually not needed. PREVENTION   Consult a sports medicine professional before beginning a new exercise program.  Walking programs offer a good workout. With walking there is a lower chance of overuse injuries common to runners. There is less impact and less jarring of the joints.  Begin all new exercise programs slowly. If problems or pain develop, decrease the amount of time or distance until you are at a comfortable level.  Wear good shoes and replace them regularly.  Stretch your foot and the heel cords at the back of the ankle (Achilles tendon) both before and after exercise.  Run or exercise on even surfaces that are not hard. For example, asphalt is better than pavement.  Do not run barefoot on hard surfaces.  If using a treadmill, vary the incline.  Do not continue to workout if you have foot or joint problems. Seek professional help if they do not improve. HOME CARE INSTRUCTIONS   Avoid activities that cause you pain until you recover.  Use ice or cold packs on the problem or painful areas after working out.  Only take over-the-counter or prescription medicines for pain, discomfort, or fever as directed by your caregiver.  Soft shoe inserts or athletic shoes with  air or gel sole cushions may be helpful.  If problems continue or become more severe, consult a sports medicine caregiver or your own health care provider. Cortisone is a potent anti-inflammatory medication that may be injected into the painful area. You can discuss this treatment with your caregiver. MAKE SURE YOU:   Understand these instructions.  Will watch your condition.  Will get help right away if you are not doing well or get worse. Document Released: 11/09/2000 Document Revised: 05/09/2011 Document Reviewed: 01/09/2008 [facility] Patient Information 2014 [place], [place].

## 2013-04-03 ENCOUNTER — Encounter: Payer: Self-pay | Admitting: Family Medicine

## 2013-04-03 DIAGNOSIS — M722 Plantar fascial fibromatosis: Secondary | ICD-10-CM | POA: Insufficient documentation

## 2013-04-03 NOTE — Assessment & Plan Note (Signed)
Currently uncontrolled. Suspect likely due to injury and stress of not being able to currently work. Currently symptomatic. -Patient to continue home blood pressure medication regimen and to return to office in one week for recheck of blood pressure

## 2013-04-03 NOTE — Progress Notes (Signed)
Patient ID: Shawn Schwartz, male   DOB: April 23, 1960, 53 y.o.   MRN: [id number]  PCP: Christa See, MD  Subjective:   HPI: Patient is a 53 y.o. male here for right ankle injury.  Patient reports he has history of gout in right foot, had this prior to current injury. Then about 3 weeks ago he lost balance stepping off curb and believes he rolled ankle - unsure which direction, likely inversion. + swelling. Pain and throbbing anterior ankle and bottom of heel. Has been elevating, using cam boot, taking anti-gout medications (colchicine) with tramadol, ibuprofen. No prior injuries to this ankle. Radiographs in ED negative for fracture.  Past Medical History  Diagnosis Date  . Diabetes mellitus     "borderline, controlled with diet"  . Hyperlipidemia   . Hypertension   . GERD (gastroesophageal reflux disease)     sporadic; occasional Prilosec    Current Outpatient Prescriptions on File Prior to Visit  Medication Sig Dispense Refill  . lisinopril-hydrochlorothiazide (PRINZIDE,ZESTORETIC) 10-12.5 MG per tablet Take 1 tablet by mouth daily.  90 tablet  3  . lovastatin (MEVACOR) 20 MG tablet Take 1 tablet (20 mg total) by mouth at bedtime.  90 tablet  3  . traMADol (ULTRAM) 50 MG tablet Take 1 tablet (50 mg total) by mouth every 8 (eight) hours as needed.  30 tablet  0   No current facility-administered medications on file prior to visit.    Past Surgical History  Procedure Laterality Date  . Hernia repair  06-05-03  . Cholecystectomy  06/04/2008    No Known Allergies  History   Social History  . Marital Status: Married    Spouse Name: N/A    Number of Children: N/A  . Years of Education: N/A   Occupational History  . Not on file.   Social History Main Topics  . Smoking status: Never Smoker   . Smokeless tobacco: Never Used  . Alcohol Use: Yes     Comment: "rare wine cooler or a few beers a month"  . Drug Use: No  . Sexual Activity: Yes    Partners: Female   Other Topics  Concern  . Not on file   Social History Narrative   Lives with wife three kids, works as a Presenter, broadcasting at [employer].    Family History  Problem Relation Age of Onset  . Heart disease Mother     deceased, MI 69  . Hypertension Mother   . Heart attack Mother   . Cancer Father     leukemia; died Jun 04, 2001  . Hypertension Father   . Depression Brother   . Diabetes Brother   . Hyperlipidemia Brother   . Hypertension Brother   . Early death Brother     "shortly after birth"    BP 178/106  Pulse 93  Ht 6' (1.829 m)  Wt 223 lb (101.152 kg)  BMI 30.24 kg/m2  Review of Systems: See HPI above.    Objective:  Physical Exam:  Gen: NAD  Right ankle: No gross deformity, ecchymoses.  Mild swelling anterior ankle. Mild limitation motion all directions. TTP ant ankle joint, plantar heel Negative ant drawer and talar tilt but talar tilt painful.   Mild pain with syndesmotic compression. Thompsons test negative. NV intact distally.    Assessment & Plan:  1. Right ankle pain - 2/2 high ankle sprain though has underlying gout as well.  Icing, nsaids.  Switch to ASO and reviewed HEP.  Start formal PT as  well.  F/u in 4 weeks.  Strict desk job in meantime.

## 2013-04-03 NOTE — Assessment & Plan Note (Signed)
Plantar fasciitis of the right foot secondary to walking on rigid soled walking boot. -Counseled patient and his symptoms will likely improve as he begins to come out of the walking boot -He was told to ice the area at least twice daily and to perform gentle stretching exercises 1-2 times daily -Return as needed, if symptoms persist could consider steroid injection

## 2013-04-03 NOTE — Assessment & Plan Note (Signed)
2/2 high ankle sprain though has underlying gout as well.  Icing, nsaids.  Switch to ASO and reviewed HEP.  Start formal PT as well.  F/u in 4 weeks.  Strict desk job in meantime.

## 2013-04-03 NOTE — Progress Notes (Signed)
   Subjective:    Patient ID: Shawn Schwartz, male    DOB: [birth year], 53 y.o.   MRN: [id number]  HPI 54 year old male presents for followup of right high ankle sprain. History of present illness reviewed from previous office visits. Patient reports that he was seen and evaluated by a sports medicine physician yesterday. He was counseled to continue to wear the cam walking boot as needed however to begin a gradual range of motion exercises over the coming weeks with decreasing use of the cam walker boot. Patient states that his pain is improving and is well-controlled with as needed Aleve. He has previously been prescribed tramadol however has only needed a total of 3 pills. He iced the ankle for the first time yesterday. He states that the swelling has improved since the initial injury. 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  Respiratory: Negative for shortness of breath.   Cardiovascular: Negative for chest pain.  Musculoskeletal: Positive for arthralgias.       Objective:   Physical Exam Vitals: Reviewed General: Pleasant Caucasian male, no acute distress Cardiac: Regular rate and rhythm, S1 and S2 present, no murmurs, no heaves or thrills Respiratory: Clear to auscultation bilaterally, normal effort MSK: Right foot and ankle-tenderness noted in the high ankle with squeeze test , mild tenderness noted over the inferior  aspect of the right lateral malleolus, mild swelling noted of the foot and ankle compare to the left, no skin changes noted, patient also had tenderness over the medial aspect of the heel consistent with plantar fasciitis, dorsalis pedis pulses and posterior tibialis pulses were 2+ bilaterally, range of motion in all directions was slightly limited due to pain and swelling, strength testing for dorsiflexion/plantarflexion/inversion/eversion of the foot was all 5 out of 5       Assessment & Plan:  Please see problem specific assessment and plan.

## 2013-04-03 NOTE — Assessment & Plan Note (Signed)
Patient is showing gradual improvement of his right ankle sprain. -Patient to increase time out of can walking boot and to perform home range of motion exercises prescribed by the sports medicine physician that he saw yesterday -Continue daily icing and NSAIDs as needed for pain -Patient provided work note through 04/03/2013, patient told to check with his employer to see if FMLA paperwork is required for light duty or additional time off -Followup as needed

## 2013-04-05 ENCOUNTER — Telehealth: Payer: Self-pay | Admitting: Family Medicine

## 2013-04-05 NOTE — Telephone Encounter (Signed)
Pt dropped off paperwork to be filled out concerning FMLA.

## 2013-04-08 NOTE — Telephone Encounter (Signed)
Pt needs OV to fill out FMLA form.  Called pt. LMVM to call back. Javier Glazier, Gerrit Heck

## 2013-04-11 ENCOUNTER — Encounter: Payer: Self-pay | Admitting: Family Medicine

## 2013-04-11 ENCOUNTER — Ambulatory Visit (INDEPENDENT_AMBULATORY_CARE_PROVIDER_SITE_OTHER): Payer: 59 | Admitting: Family Medicine

## 2013-04-11 VITALS — BP 162/98 | HR 74 | Ht 72.0 in | Wt 230.0 lb

## 2013-04-11 DIAGNOSIS — H9209 Otalgia, unspecified ear: Secondary | ICD-10-CM

## 2013-04-11 DIAGNOSIS — S93491A Sprain of other ligament of right ankle, initial encounter: Secondary | ICD-10-CM

## 2013-04-11 DIAGNOSIS — S93439A Sprain of tibiofibular ligament of unspecified ankle, initial encounter: Secondary | ICD-10-CM

## 2013-04-11 DIAGNOSIS — I10 Essential (primary) hypertension: Secondary | ICD-10-CM

## 2013-04-11 DIAGNOSIS — S93431A Sprain of tibiofibular ligament of right ankle, initial encounter: Principal | ICD-10-CM

## 2013-04-11 DIAGNOSIS — M722 Plantar fascial fibromatosis: Secondary | ICD-10-CM

## 2013-04-11 DIAGNOSIS — H9202 Otalgia, left ear: Secondary | ICD-10-CM

## 2013-04-11 NOTE — Assessment & Plan Note (Signed)
Minor, self-limited, possibly viral in nature (children with recent viral illness) or with component of uncontrolled HTN. Physical exam unrevealing. Continue supportive measures as per HPI, f/u as needed. Red flags reviewed.

## 2013-04-11 NOTE — Patient Instructions (Signed)
Thank you for coming in, today!  For your ankle:  Keep using your boot as instructed. Keep doing your exercises. Freeze a waterbottle to roll the bottom of your foot to stretch it.  You can also use a tennis ball for the same thing. Call the sports medicine office in [facility] to ask them two things - find out when your physical therapy needs to start and when - ask if you can follow up here [facility] sports medicine office) instead of there in 4 weeks  For your blood pressure: Start taking TWO of your pills once a day instead of one. Once you're out of pill this time, call me and we'll change the prescription. Come back in about 1 month to get things rechecked. If you're out doing errands, check your blood pressure at a pharmacy, write it down, and bring it back with you.  For your ear: This doesn't look infected. You can keep using vinegar mixed with water, or hydrogen peroxide to rinse your ears out. If it keeps bothering you, let me know.  Come back in about 1 month as above, or sooner if you need me. Please feel free to call with any questions or concerns at any time, at [phone]. --Dr. Venetia Maxon

## 2013-04-11 NOTE — Assessment & Plan Note (Signed)
BP remains uncontrolled, likely with contribution of ankle pain and stress of work, completing paperwork, making doctor's appointments and other work hoops to jump through, as well as illness of children recently. Intermittently symptomatic, with some improvement as ankle has improved. Recommended increasing lisinopril-HCTZ 10-12.5 to 20-25 (by taking two pills instead of one until current Rx finished). Plan to f/u in the next few weeks to recheck, and will likely recheck BMP at that time. Otherwise advised pt to check BP at home intermittently and to bring in a log with him, next time. Reviewed red flags that would prompt return to clinic sooner or presentation to the ED.

## 2013-04-11 NOTE — Assessment & Plan Note (Signed)
Related to foot pain / ankle sprain and walker boot. Continue supportive measures and f/u with sports medicine. See also ankle sprain problem list note.

## 2013-04-11 NOTE — Assessment & Plan Note (Addendum)
Greater than 25 minutes spent in face-to-face contact with pt, more than half of which was devoted to filling out FMLA paperwork, counseling on exercises / PT / appropriate follow-up, and coordination of follow-up, as well as management / instructions of HTN (see separate problem list notes).  Exam consistent with sprain plus component of plantar fasciitis. Overall improving slowly, though still with significant limitation to mobility secondary to pain. Plan to continue walker boot, ice / rest, NSAIDs as needed, and f/u with sports medicine as previously instructed, and pt instructed today to call them to make specific appointments for f/u and to double check referral to PT. Also reiterated instructions to roll a frozen water bottle or tennis ball to help stretch out his foot and ankle. FMLA paperwork completed today with copy to be scanned into pt's chart; specified strict desk work / light duty as previously recommended by Dr. Barbaraann Barthel. Work note also provided. Otherwise f/u as needed.

## 2013-04-11 NOTE — Progress Notes (Signed)
   Subjective:    Patient ID: Shawn Schwartz, male    DOB: 1960/10/29, 53 y.o.   MRN: [id number]  HPI: Pt presents to clinic for follow-up of several issues.  HTN - pt reports compliance with lisinopril-HCTZ 10-12.5 - he has had periods of elevated blood pressure with pain of his ankle/foot and stress with figuring out work scheduling - he has had dizziness intermittently and headache, which has been helped some with ibuprofen and rest - he did have some chest pain very briefly in his left upper chest four days ago, that passed with rest (this was a during a very loud, stressful time at home) - he has not had chest pain otherwise, LE swelling, SOB  Plantar fasciitis / sprained ankle - saw sports medicine 2/2; was supposed to return to work with strict desk duty but was unable due to scheduling office appointments - is supposed to follow-up with sports medicine in 4 weeks; has not yet started physical therapy yet - still with pain, though boot and Advil has been helping; worst is tissue on bottom of his foot - worse with walking, weightbearing - exercises at night are helping (ROM, strengthening) - needs FMLA paperwork filled out; 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 Otherwise denies fever / chills, abdominal pain. He does have some brisk BM's immediately after eating, some loose, but this has been variable.     Objective:   Physical Exam BP 161/102  Pulse 74  Ht 6' (1.829 m)  Wt 230 lb (104.327 kg)  BMI 31.19 kg/m2 Recheck BP manual: 162/98 HEENT: PERRLA, EOMI, MMM,  TM's bilaterally clear with external ear canals normal Cardio: RRR, no murmur appreciated Pulm: CTAB, no wheezes MSK - right ankle normal to inspection other than slight soft-tissue puffiness, through less than previously documented  No skin breakdown or other lesions noted  Mild tenderness to palpation over lateral malleolus and medial malleolus as well as ligamentous attachments to foot  Mild tenderness to palpation over gastrocnemius with squeeze  No heel squeeze tenderness, Achilles tendon intact  Ankle will full passive ROM, pain slightly increased especially with dorsiflexion and eversion  No ankle instability, no other foot tenderness Neurovascular - bilateral LE strength 5/5 with knee extension / flexion; distal sensation grossly intact  Strength 4+/5 with dorsiflexion and plantarflexion secondary to pain  Strength 4-/5 with eversion / inversion against resistance again secondary to pain Ext: otherwise warm, well-perfused, no LE edema noted     Assessment & Plan:  Greater than 25 minutes spend in face-to-face contact with patient, more than half was spent in counseling for FMLA paperwork completion, coordination of care for physical therapy, and development of treatment planning for managing HTN. See separate problem list notes.

## 2013-04-22 ENCOUNTER — Telehealth: Payer: Self-pay | Admitting: Family Medicine

## 2013-04-22 NOTE — Telephone Encounter (Signed)
Needs a note for jury duty just like the one he got for work Please advsie

## 2013-04-22 NOTE — Telephone Encounter (Signed)
Will fwd. To PCP for review .Damien Cisar  

## 2013-04-23 ENCOUNTER — Encounter: Payer: Self-pay | Admitting: Family Medicine

## 2013-04-23 NOTE — Telephone Encounter (Signed)
Left voice mail on pt's home machine informing him that a letter has been written and signed and left up front for him to pick up, stating that he was seen by sports medicine and by me starting on 2/2, and has been recommended to avoid sitting or standing (that is, staying in one position) for more than 15 minutes at any given time, and that he should be excused from jury duty responsibilities for at least the next 4 weeks while these restrictions are in effect. He can pick up the letter at the front desk at his convenience and can follow up with me / sports medicine as instructed or as needed. --CMS

## 2013-05-14 ENCOUNTER — Encounter: Payer: Self-pay | Admitting: Family Medicine

## 2013-05-14 ENCOUNTER — Ambulatory Visit (INDEPENDENT_AMBULATORY_CARE_PROVIDER_SITE_OTHER): Payer: 59 | Admitting: Family Medicine

## 2013-05-14 VITALS — BP 144/88 | HR 103 | Ht 72.0 in | Wt 230.0 lb

## 2013-05-14 DIAGNOSIS — S93401A Sprain of unspecified ligament of right ankle, initial encounter: Secondary | ICD-10-CM

## 2013-05-14 DIAGNOSIS — S93409A Sprain of unspecified ligament of unspecified ankle, initial encounter: Secondary | ICD-10-CM

## 2013-05-14 NOTE — Patient Instructions (Signed)
Thank you for coming in today  1. Stop the boot 2. Wear lace up brace during work and when you are on your feet. 3. Ibuprofen as needed 4. Ice in bucket of ice water for 15 minutes after work 5. Add strengthening exercises with pushing against wall in all directions 10 x 5 secs each direction. Also one leg balance exercises 3 x 30 seconds. 6. Followup 3 weeks

## 2013-05-15 NOTE — Progress Notes (Signed)
CC: Followup of right ankle sprain HPI: Patient returns for followup of right ankle sprain. He is actually new to me today but had previously been seen at one of our other sports medicine facility's. He suffered this injury in early January. He stepped down off of a  curb funny. He was having a gout flare at the time so he does not recall tweaking his ankle. His ankle was already hurting. He has been in a Banker but has not been wearing this at around the house. He is also been doing some range of motion exercises. He is working light duty at work. He is about 60% better at this point  ROS: As above in the HPI. All other systems are stable or negative.  OBJECTIVE: APPEARANCE:  Patient in no acute distress.The patient appeared well nourished and normally developed. HEENT: No scleral icterus. Conjunctiva non-injected Resp: Non labored Skin: No rash MSK: Right Ankle: No visible erythema or swelling. Range of motion is full in all directions. Strength is 5/5 in all directions. Stable lateral and medial ligaments; squeeze test and kleiger test unremarkable; Talar dome nontender; No pain at base of 5th MT; No tenderness over cuboid; No tenderness over N spot or navicular prominence No tenderness on posterior aspects of lateral and medial malleolus Able to walk 4 steps.  MSK [place]: NOT PERFORMED   ASSESSMENT:  #1. Right ankle sprain, improving   PLAN: He is to come out of the boot at this point. He will transition to a lace up ankle brace which he should wear at work it when he is out on his feet. I demonstrated progression of rehabilitation exercises including strengthening and proprioception. He was given a work note reflecting normal   work duty with walking and  stairclimbing but with no heavy lifting as he is concerned he is not ready for this yet. He will follow up with me in 3 weeks at which time I hope is completely improved we can return him to normal work.

## 2013-05-21 ENCOUNTER — Telehealth: Payer: Self-pay | Admitting: *Deleted

## 2013-05-21 ENCOUNTER — Encounter (HOSPITAL_COMMUNITY): Payer: Self-pay | Admitting: Emergency Medicine

## 2013-05-21 ENCOUNTER — Emergency Department (HOSPITAL_COMMUNITY): Payer: 59

## 2013-05-21 ENCOUNTER — Observation Stay (HOSPITAL_COMMUNITY)
Admission: EM | Admit: 2013-05-21 | Discharge: 2013-05-22 | Disposition: A | Discharge status: Home / Self Care | Payer: 59 | Attending: Family Medicine | Admitting: Family Medicine

## 2013-05-21 DIAGNOSIS — S93409A Sprain of unspecified ligament of unspecified ankle, initial encounter: Secondary | ICD-10-CM | POA: Insufficient documentation

## 2013-05-21 DIAGNOSIS — I10 Essential (primary) hypertension: Secondary | ICD-10-CM

## 2013-05-21 DIAGNOSIS — R200 Anesthesia of skin: Secondary | ICD-10-CM

## 2013-05-21 DIAGNOSIS — G319 Degenerative disease of nervous system, unspecified: Secondary | ICD-10-CM | POA: Insufficient documentation

## 2013-05-21 DIAGNOSIS — R7309 Other abnormal glucose: Secondary | ICD-10-CM | POA: Insufficient documentation

## 2013-05-21 DIAGNOSIS — X58XXXA Exposure to other specified factors, initial encounter: Secondary | ICD-10-CM | POA: Insufficient documentation

## 2013-05-21 DIAGNOSIS — E785 Hyperlipidemia, unspecified: Secondary | ICD-10-CM

## 2013-05-21 DIAGNOSIS — K089 Disorder of teeth and supporting structures, unspecified: Secondary | ICD-10-CM | POA: Insufficient documentation

## 2013-05-21 DIAGNOSIS — K219 Gastro-esophageal reflux disease without esophagitis: Secondary | ICD-10-CM

## 2013-05-21 DIAGNOSIS — R202 Paresthesia of skin: Secondary | ICD-10-CM

## 2013-05-21 DIAGNOSIS — G459 Transient cerebral ischemic attack, unspecified: Principal | ICD-10-CM | POA: Diagnosis present

## 2013-05-21 DIAGNOSIS — R209 Unspecified disturbances of skin sensation: Secondary | ICD-10-CM

## 2013-05-21 DIAGNOSIS — E119 Type 2 diabetes mellitus without complications: Secondary | ICD-10-CM

## 2013-05-21 LAB — BASIC METABOLIC PANEL
BUN: 14 mg/dL (ref 6–23)
CALCIUM: 9.5 mg/dL (ref 8.4–10.5)
CO2: 26 mEq/L (ref 19–32)
Chloride: 99 mEq/L (ref 96–112)
Creatinine, Ser: 0.94 mg/dL (ref 0.50–1.35)
Glucose, Bld: 139 mg/dL — ABNORMAL HIGH (ref 70–99)
POTASSIUM: 3.7 meq/L (ref 3.7–5.3)
Sodium: 137 mEq/L (ref 137–147)

## 2013-05-21 LAB — CBC
HCT: 42.2 % (ref 39.0–52.0)
Hemoglobin: 14.9 g/dL (ref 13.0–17.0)
MCH: 29.3 pg (ref 26.0–34.0)
MCHC: 35.3 g/dL (ref 30.0–36.0)
MCV: 83.1 fL (ref 78.0–100.0)
PLATELETS: 264 10*3/uL (ref 150–400)
RBC: 5.08 MIL/uL (ref 4.22–5.81)
RDW: 12.8 % (ref 11.5–15.5)
WBC: 8.6 10*3/uL (ref 4.0–10.5)

## 2013-05-21 LAB — GLUCOSE, CAPILLARY: GLUCOSE-CAPILLARY: 107 mg/dL — AB (ref 70–99)

## 2013-05-21 LAB — I-STAT TROPONIN, ED: TROPONIN I, POC: 0.01 ng/mL (ref 0.00–0.08)

## 2013-05-21 MED ORDER — SIMVASTATIN 5 MG PO TABS
5.0000 mg | ORAL_TABLET | Freq: Every day | ORAL | Status: DC
  Administered 2013-05-22: 5 mg via ORAL
  Filled 2013-05-21: qty 1

## 2013-05-21 MED ORDER — LISINOPRIL-HYDROCHLOROTHIAZIDE 10-12.5 MG PO TABS: 2.0000 | ORAL_TABLET | Freq: Every day | ORAL | Status: DC

## 2013-05-21 MED ORDER — HEPARIN SODIUM (PORCINE) 5000 UNIT/ML IJ SOLN
5000.0000 [IU] | Freq: Three times a day (TID) | INTRAMUSCULAR | Status: DC
  Administered 2013-05-21 – 2013-05-22 (×3): 5000 [IU] via SUBCUTANEOUS
  Filled 2013-05-21 (×5): qty 1

## 2013-05-21 MED ORDER — HYDROCHLOROTHIAZIDE 25 MG PO TABS
25.0000 mg | ORAL_TABLET | Freq: Every day | ORAL | Status: DC
  Administered 2013-05-22: 25 mg via ORAL
  Filled 2013-05-21: qty 1

## 2013-05-21 MED ORDER — LISINOPRIL 20 MG PO TABS
20.0000 mg | ORAL_TABLET | Freq: Every day | ORAL | Status: DC
  Administered 2013-05-22: 20 mg via ORAL
  Filled 2013-05-21: qty 1

## 2013-05-21 MED ORDER — ASPIRIN 81 MG PO CHEW
81.0000 mg | CHEWABLE_TABLET | Freq: Every day | ORAL | Status: DC
  Administered 2013-05-22: 81 mg via ORAL
  Filled 2013-05-21: qty 1

## 2013-05-21 MED ORDER — ASPIRIN EC 325 MG PO TBEC
325.0000 mg | DELAYED_RELEASE_TABLET | Freq: Once | ORAL | Status: AC
  Administered 2013-05-21: 325 mg via ORAL
  Filled 2013-05-21: qty 1

## 2013-05-21 NOTE — Progress Notes (Signed)
Called for report to ED, RN not available left name and number. Awaiting call back.

## 2013-05-21 NOTE — ED Notes (Signed)
Patient transported to X-ray 

## 2013-05-21 NOTE — ED Notes (Signed)
Pt up to BR to void.  No difficulty ambulating.  Wife and son at bedside.

## 2013-05-21 NOTE — ED Notes (Signed)
No change in status.  Speech remains clear; MAE.

## 2013-05-21 NOTE — H&P (Signed)
[facility] Admission History and Physical Service Pager: [phone]  Patient name: Shawn Schwartz Medical record number: [id number] Date of birth: 1960-09-26 Age: 53 y.o. Gender: male  Primary Care Provider: Christa See, MD Consultants: none Code Status: Full   Chief Complaint: numbness in left side and speech changes  Assessment and Plan: Shawn Schwartz is a 53 y.o. male presenting with TIA. PMH is significant for HTN, HLD, and GERD.    #TIA: symptoms lasted for a few minutes and resolved spontaneously indicating TIA. CT head negative and EKG normal. He symptoms have resolved. ABCD2 score: 3, indicating low risk of progression to stroke.  No seizure like activity or migraine type symptoms. Not hypoglycemic on initial labs.  - admitted to telemetry, Dr. Nori Riis attending  - MRI and MRA  - carotid dopplers  - ECHO - ASA 325 in ED, 81 mg daily (not previously on ASA) - telemetry  - EKG AM  - Risk stratification labs: Hgb A1c, lipid panel  - neuro checks q4  - urine drug screen   #possible infected tooth: states his right upper wisdom tooth began hurting a few days ago. He puts off going to the dentist. He denies any fever. No swelling or erythema on exam. Tender to palpation directly to tooth.  - orthopantogram pending  - will hold off initiating ABX until x-ray is read as there is no fever or signs of infection on exam   #HTN: controlled on admission. Is compliant with medications. Ranging from 110-129/66-70 in the ED.  - lisinopril-HCTZ 10-12.5 mg, upon chart review PCP indicated wanting to increase to 20-25 mg by taking two pills.   #Right high ankle sprain: currently wrapped in ankle wrap. No swelling on exam. Pulses intact distally. No ecchymosis and 5/5 strength on plantar and dosiflexion. NSAIDS for pain as outpatient.  - holding NSAIDS for now.   #HLD: currently on lovastatin 20 mg at home  - change to formulary  - lipid panel as above  -  consider change to high intensity statin  FEN/GI: saline lock/ heart healthy diet  Prophylaxis: heparin subq  Disposition: admitted to FPTS for TIA, Dr. Nori Riis attending.   History of Present Illness: [name] is a 53 y.o. male presenting with TIA. All of his symptoms started last night when he went to bed and was fine at that time, 12:30 am. Woke up at 5 am and left arm was completely numb. Moved it around and the numbness went away. Then came back. Then noticed left leg went numb. In leg lasted a few seconds. In arm lasted a little bit longer. Got up around 12:45 pm and felt dizzy. Felt like he had no energy. Wife told him he was saying his words slowly and not correctly. The word difficulties lasted 15 minutes. They note his blood pressure has been up recently. Normally when BP up gets headache and face redness. Has never had this before. No recent travel. No history of heart problems. Mom died with massive heart attack in her 34's. Dad had leukemia. No shortness of breath or chest pain. Has been "worn out" though. No changes in BM or urination.  Also notes right upper tooth pain. Wife spoke to dentist today and was to be seen tomorrow morning to be evaluated for this. No noted puss, swelling or erythema.   Review Of Systems: Per HPI with the following additions: See HPI  Otherwise 12 point review of systems was performed and was unremarkable.  Patient Active Problem List   Diagnosis Date Noted  . Otalgia of left ear 04/11/2013  . Plantar fasciitis, right 04/03/2013  . High ankle sprain of right lower extremity 03/22/2013  . Foot pain, right 03/18/2013  . Health maintenance examination 09/27/2012  . Hyperlipidemia 09/13/2011  . Hypertension 09/13/2011  . Type 2 diabetes mellitus 09/13/2011  . GERD (gastroesophageal reflux disease) 09/13/2011   Past Medical History: Past Medical History  Diagnosis Date  . Diabetes mellitus     "borderline, controlled with diet"  . Hyperlipidemia    . Hypertension   . GERD (gastroesophageal reflux disease)     sporadic; occasional Prilosec   Past Surgical History: Past Surgical History  Procedure Laterality Date  . Hernia repair  06-16-2003  . Cholecystectomy  06/15/2008   Social History: History  Substance Use Topics  . Smoking status: Never Smoker   . Smokeless tobacco: Never Used  . Alcohol Use: Yes     Comment: "rare wine cooler or a few beers a month"   Additional social history: none   Please also refer to relevant sections of EMR.  Family History: Family History  Problem Relation Age of Onset  . Heart disease Mother     deceased, MI 19  . Hypertension Mother   . Heart attack Mother   . Cancer Father     leukemia; died 2001/06/15  . Hypertension Father   . Depression Brother   . Diabetes Brother   . Hyperlipidemia Brother   . Hypertension Brother   . Early death Brother     "shortly after birth"   Allergies and Medications: No Known Allergies No current facility-administered medications on file prior to encounter.   Current Outpatient Prescriptions on File Prior to Encounter  Medication Sig Dispense Refill  . lisinopril-hydrochlorothiazide (PRINZIDE,ZESTORETIC) 10-12.5 MG per tablet Take 2 tablets by mouth daily.         Objective: BP 123/79  Pulse 71  SpO2 97% Exam: General: NAD, alert, cooperative with exam, caucasian male,   HEENT: [place]/AT, EOMI, PERRL, right upper back molar tender to palpation, no warmth, swelling or erythema, no fluctuance or puss drainage, oropharynx clear,  Cardiovascular: S1S2, RRR, no murmurs, rubs or gallops, no carotid bruit.  Respiratory: CTAB, no wheezing, crackles, or rhonchi Abdomen: soft, NTND, soft, +BS Extremities: warm and well perfused, moving all extremities freely, 5/5 strength in upper and lower extremities Skin: no rashes, normal skin turgor  Neuro: CN 2-12 intact, negative Romberg, normal heel to shin testing, normal alternative hand flapping, normal finger to nose  testing, normal gait but felt weak upon prolonged standing, sensation to light touch intact throughout bilateral upper and lower extremities  Labs and Imaging: CBC BMET   Recent Labs Lab 05/21/13 1709  WBC 8.6  HGB 14.9  HCT 42.2  PLT 264    Recent Labs Lab 05/21/13 1709  NA 137  K 3.7  CL 99  CO2 26  BUN 14  CREATININE 0.94  GLUCOSE 139*  CALCIUM 9.5     EKG: NSR  CT head: IMPRESSION: Negative CT of the head.  CXR: IMPRESSION: No active cardiopulmonary disease   Rosemarie Ax, MD 05/21/2013, 9:39 PM PGY-1, [place] Intern pager: [phone], text pages welcome  Upper Level Addendum:  I have seen and evaluated this patient along with Dr. Raeford Razor and reviewed the above note, making necessary revisions in red.   Tommi Rumps, MD Family Medicine PGY-2

## 2013-05-21 NOTE — Telephone Encounter (Signed)
Received called from pt's wife that pt is complaining left side numbness off and on of the extremities all day.  Wife also stated that pt blood pressure has been elevated.  Has appt with denist in the AM for possible tooth infection.  Per Dr. Venetia Maxon have pt go to ER for evaluation.   Mrs. Vandoren stated understanding.  Derl Barrow, RN

## 2013-05-21 NOTE — ED Provider Notes (Signed)
CSN: [id number]     Arrival date & time 05/21/13  1652 History   First MD Initiated Contact with Patient 05/21/13 1859     Chief Complaint  Patient presents with  . Numbness  . Shortness of Breath      HPI Per pt sts that he woke up this am and was having numbness on his left side. sts also that his wife told him he has been slurring his words. No slurred speech noted currently. Denies chest pain but sts had some earlier. Denies numbness currently. sts has been dealing with a dental infection and right ankle issues. Pt sts he feels tired.   Past Medical History  Diagnosis Date  . Diabetes mellitus     "borderline, controlled with diet"  . Hyperlipidemia   . Hypertension   . GERD (gastroesophageal reflux disease)     sporadic; occasional Prilosec   Past Surgical History  Procedure Laterality Date  . Hernia repair  06/15/2003  . Cholecystectomy  06-14-08   Family History  Problem Relation Age of Onset  . Heart disease Mother     deceased, MI 26  . Hypertension Mother   . Heart attack Mother   . Cancer Father     leukemia; died June 14, 2001  . Hypertension Father   . Depression Brother   . Diabetes Brother   . Hyperlipidemia Brother   . Hypertension Brother   . Early death Brother     "shortly after birth"   History  Substance Use Topics  . Smoking status: Never Smoker   . Smokeless tobacco: Never Used  . Alcohol Use: Yes     Comment: "rare wine cooler or a few beers a month"    Review of Systems   All other systems reviewed and are negative Allergies  Review of patient's allergies indicates no known allergies.  Home Medications   Current Outpatient Rx  Name  Route  Sig  Dispense  Refill  . ibuprofen (ADVIL,MOTRIN) 200 MG tablet   Oral   Take 800 mg by mouth every 8 (eight) hours as needed for mild pain.         Marland Kitchen lisinopril-hydrochlorothiazide (PRINZIDE,ZESTORETIC) 10-12.5 MG per tablet   Oral   Take 2 tablets by mouth daily.          Marland Kitchen lovastatin (MEVACOR) 20  MG tablet   Oral   Take 20 mg by mouth daily.         . naproxen sodium (ANAPROX) 220 MG tablet   Oral   Take 440 mg by mouth daily as needed (for pain).         Marland Kitchen aspirin 81 MG chewable tablet   Oral   Chew 1 tablet (81 mg total) by mouth daily.   30 tablet   11    BP 107/71  Pulse 94  Temp(Src) 98.6 F (37 C) (Oral)  Resp 18  Ht 6' (1.829 m)  Wt 219 lb 3.2 oz (99.428 kg)  BMI 29.72 kg/m2  SpO2 96% Physical Exam  Nursing note and vitals reviewed. Constitutional: He is oriented to person, place, and time. He appears well-developed and well-nourished. No distress.  HENT:  Head: Normocephalic and atraumatic.  Eyes: Pupils are equal, round, and reactive to light.  Neck: Normal range of motion.  Cardiovascular: Normal rate and intact distal pulses.   Pulmonary/Chest: No respiratory distress.  Abdominal: Normal appearance. He exhibits no distension.  Musculoskeletal: Normal range of motion.  Neurological: He is alert and oriented to  person, place, and time. No cranial nerve deficit. He exhibits normal muscle tone. Coordination normal. GCS eye subscore is 4. GCS verbal subscore is 5. GCS motor subscore is 6.  Skin: Skin is warm and dry. No rash noted.  Psychiatric: He has a normal mood and affect. His behavior is normal.    ED Course  Procedures (including critical care time) Labs Review Labs Reviewed  BASIC METABOLIC PANEL - Abnormal; Notable for the following:    Glucose, Bld 139 (*)    All other components within normal limits  LIPID PANEL - Abnormal; Notable for the following:    Triglycerides 159 (*)    HDL 34 (*)    All other components within normal limits  GLUCOSE, CAPILLARY - Abnormal; Notable for the following:    Glucose-Capillary 107 (*)    All other components within normal limits  HEMOGLOBIN A1C - Abnormal; Notable for the following:    Hemoglobin A1C 6.4 (*)    Mean Plasma Glucose 137 (*)    All other components within normal limits  CBC  URINE  RAPID DRUG SCREEN (HOSP PERFORMED)  I-STAT TROPOININ, ED   Imaging Review No results found.   EKG Interpretation   Date/Time:  Tuesday May 21 2013 16:58:56 EDT Ventricular Rate:  94 PR Interval:  140 QRS Duration: 96 QT Interval:  340 QTC Calculation: 425 R Axis:   47 Text Interpretation:  Normal sinus rhythm Normal ECG Confirmed by Gertrude Bucks   MD, Verlinda Slotnick ([id number]) on 05/21/2013 9:09:07 PM       FP consulted for evauation and workup of symptoms.  Possible TIA?? MDM   Final diagnoses:  Numbness and tingling in left arm        Dot Lanes, MD 05/24/13 1252

## 2013-05-21 NOTE — ED Notes (Signed)
Report to floor RN

## 2013-05-21 NOTE — ED Notes (Signed)
Returned to room from xray

## 2013-05-21 NOTE — ED Notes (Signed)
Pt states he woke up approx 1100 today, felt N/T left arm; a little later pt felt some tingling left leg and wife thought his speech was a bit slow.  All now resolved.  Speech clear, denies n/t.

## 2013-05-21 NOTE — ED Notes (Signed)
Per pt sts that he woke up this am and was having numbness on his left side. sts also that his wife told him he has been slurring his words. No slurred speech noted currently. Denies chest pain but sts had some earlier. Denies numbness currently. sts has been dealing with a dental infection and right ankle issues. Pt sts he feels tired.

## 2013-05-22 ENCOUNTER — Observation Stay (HOSPITAL_COMMUNITY): Payer: 59

## 2013-05-22 DIAGNOSIS — I517 Cardiomegaly: Secondary | ICD-10-CM

## 2013-05-22 LAB — RAPID URINE DRUG SCREEN, HOSP PERFORMED
AMPHETAMINES: NOT DETECTED
Barbiturates: NOT DETECTED
Benzodiazepines: NOT DETECTED
COCAINE: NOT DETECTED
Opiates: NOT DETECTED
Tetrahydrocannabinol: NOT DETECTED

## 2013-05-22 LAB — LIPID PANEL
CHOLESTEROL: 156 mg/dL (ref 0–200)
HDL: 34 mg/dL — ABNORMAL LOW (ref >39)
LDL Cholesterol: 90 mg/dL (ref 0–99)
TRIGLYCERIDES: 159 mg/dL — AB (ref <150)
Total CHOL/HDL Ratio: 4.6 RATIO
VLDL: 32 mg/dL (ref 0–40)

## 2013-05-22 LAB — HEMOGLOBIN A1C
HEMOGLOBIN A1C: 6.4 % — AB (ref <5.7)
Mean Plasma Glucose: 137 mg/dL — ABNORMAL HIGH (ref <117)

## 2013-05-22 MED ORDER — ASPIRIN 81 MG PO CHEW: 81.0000 mg | CHEWABLE_TABLET | Freq: Every day | ORAL | Status: DC

## 2013-05-22 NOTE — Progress Notes (Signed)
Echocardiogram 2D Echocardiogram has been performed.  Landry Mellow, RDMS, RVT  05/22/2013, 10:27 AM

## 2013-05-22 NOTE — Progress Notes (Signed)
UR Completed.  Srishti Strnad Jane [phone] 05/22/2013  

## 2013-05-22 NOTE — H&P (Signed)
FMTS Attending Admission Note: Sara Neal MD [phone] pager office [phone] I  have seen and examined this patient, reviewed their chart. I have discussed this patient with the resident. I agree with the resident's findings, assessment and care plan. 

## 2013-05-22 NOTE — Progress Notes (Signed)
Received report on pt at 2314 from ED RN. Pt arrived on unit 2335 by nurse tech. Pt alert and oriented to room and call light. Bed alarm on and educated about calling before getting up. Assessment and documentation gathered. Gave pt happy meal and water. Will continue to monitor.

## 2013-05-22 NOTE — Discharge Instructions (Signed)
It is not exactly certain what caused your symptoms and weakness, we think right now it is most likely a Transient ischemic attack (TIA), which can present similarly to a stroke but the symptoms resolve within 24 hours. The imaging of your head (CT, MRI) showed you did not have a stroke, and the blood vessels in your head and neck do not have any significant narrowing or atherosclerosis that would require additional treatment. The ultrasound of your heart was also normal.   The blood test to look at your blood sugar for the last 3 months (Hemoglobin A1c) was still pending by the time of discharge. Dr. Raeford Razor will discuss this with you at your follow up next week.   We recommend you start taking a daily baby aspirin (81mg ), as it will decrease your risk of stroke and heart attack. You are also taking ibuprofen and naproxen (aleve); these are all within the same drug class. Try to use the ibuprofen and aleve sparingly, as it can decrease some of your kidney function and cause stomach irritation if taken daily/on a chronic basis. If you start experiencing heart burn your primary doctor can give you an additional medication to help control your stomach acid.

## 2013-05-22 NOTE — Progress Notes (Signed)
FMTS Attending Daily Note: Dorcas Mcmurray MD [phone] pager office [phone] I  have seen and examined this patient, reviewed their chart. I have discussed this patient with the resident. I agree with the resident's findings, assessment and care plan. No return of symptoms, we are risk stratifying. I would like to see UDS looking  For any past illicit (sp cocaine) which would be increased risk for CV issues. Rest of workup is otherwise negative but still pending ECHO and carotids. Will d/c on aspirin.

## 2013-05-22 NOTE — Progress Notes (Signed)
Family Medicine Teaching Service Daily Progress Note Intern Pager: [phone]  Patient name: Shawn Schwartz Medical record number: [id number] Date of birth: 1960/12/17 Age: 53 y.o. Gender: male  Primary Care Provider: Christa See, MD Consultants: none Code Status: Full  Pt Overview and Major Events to Date:   Assessment and Plan: Shawn Schwartz is a 53 y.o. male presenting with TIA. PMH is significant for HTN, HLD, and GERD.   # TIA: symptoms lasted for a few minutes and resolved spontaneously indicating TIA. CT head negative and EKG normal. He symptoms have resolved. ABCD2 score: 3, indicating low risk of progression to stroke. No seizure like activity or migraine type symptoms. Not hypoglycemic on initial labs.  - MRI (slight premature for age atrophy, no stroke) and MRA (unremarkable) - carotid dopplers (pending) - ECHO (pending) - ASA 81 mg daily (not previously on ASA)  - Risk stratification labs: Hgb A1c (in process), lipid panel (C 156, TG 159, HDL 34, LDL 90) - urine drug screen (needs to be collected)  # ?Infected tooth: states his right upper wisdom tooth began hurting a few days ago. He puts off going to the dentist. He denies any fever. No swelling or erythema on exam. Tender to palpation directly to tooth.  - orthopantogram with "multiple periapical lucencies" but no definitive abscess - will hold off initiating ABX until x-ray is read as there is no fever or signs of infection on exam   # HTN: controlled on admission. Ranging from 100-139/54-91 overnight.  - lisinopril-HCTZ 10-12.5 mg (though PCP mentioned in note wanting to increase)  # Right high ankle sprain: NSAIDS for pain as outpatient.  - holding NSAIDS for now.   # HLD: currently on lovastatin 20 mg at home  - change to formulary  - lipid panel as above  - consider change to high intensity statin   FEN/GI: saline lock/ heart healthy diet  Prophylaxis: heparin subq  Disposition: pending  workup  Subjective:  No complaints this morning other than "fatigue". Denies pain, weakness has all resolved, he has not had any difficulty with speech, no changes in vision. He has been out of bed and walking around.   Objective: Temp:  [97.7 F (36.5 C)-98 F (36.7 C)] 98 F (36.7 C) (03/25 0249) Pulse Rate:  [62-93] 64 (03/25 0249) Resp:  [18] 18 (03/25 0249) BP: (100-139)/(54-91) 100/54 mmHg (03/25 0249) SpO2:  [94 %-99 %] 94 % (03/25 0249) Weight:  [219 lb 3.2 oz (99.428 kg)] 219 lb 3.2 oz (99.428 kg) (03/24 2330) Physical Exam: General: NAD Cardiovascular: RRR, normal heart sounds, no murmurs Respiratory: CTAB, normal effort Abdomen: soft, NTND Extremities: no edema or cyanosis, WWP Neuro: alert and oriented, CN2-12 normal. Strength 5/5 grip, biceps, triceps, hip flexion, ankle extension/flexion. 2+ biceps and patellar reflexes. No pronator drift. Babinski downgoing bilaterally.  Laboratory:  Recent Labs Lab 05/21/13 1709  WBC 8.6  HGB 14.9  HCT 42.2  PLT 264    Recent Labs Lab 05/21/13 1709  NA 137  K 3.7  CL 99  CO2 26  BUN 14  CREATININE 0.94  CALCIUM 9.5  GLUCOSE 139*   Lipid Panel     Component Value Date/Time   CHOL 156 05/22/2013 0410   TRIG 159* 05/22/2013 0410   HDL 34* 05/22/2013 0410   CHOLHDL 4.6 05/22/2013 0410   VLDL 32 05/22/2013 0410   LDLCALC 90 05/22/2013 0410     Imaging/Diagnostic Tests: Dg Orthopantogram IMPRESSION: Multiple periapical lucencies and absent teeth.  Electronically Signed   By: Elon Alas   On: 05/22/2013 00:28   Dg Chest 2 View IMPRESSION: No active cardiopulmonary disease.   Electronically Signed   By: Dereck Ligas M.D.   On: 05/21/2013 19:57   Ct Head Wo Contrast IMPRESSION: Negative CT of the head.   Electronically Signed   By: Lawrence Santiago M.D.   On: 05/21/2013 20:06   Mri Brain Without Contrast Mr Jodene Nam Head/brain Wo Cm IMPRESSION: Slight premature for age cerebral and cerebellar atrophy. No  significant white matter disease.  No evidence for acute stroke or intracranial mass lesion.  Unremarkable MRA of the intracranial circulation.   Electronically Signed   By: Rolla Flatten M.D.   On: 05/22/2013 07:55    Tawanna Sat, MD 05/22/2013, 7:57 AM PGY-1, [place] Intern pager: [phone], text pages welcome

## 2013-05-22 NOTE — Progress Notes (Addendum)
Patient d/ced this evening. Instructions given and all questions answered. Assessments remain unchanged prior to discharge.

## 2013-05-22 NOTE — Progress Notes (Signed)
VASCULAR LAB PRELIMINARY  PRELIMINARY  PRELIMINARY  PRELIMINARY  Carotid duplex completed.    Preliminary report:  Bilateral:  1-39% ICA stenosis lowest end of scale.  Vertebral artery flow is antegrade.     Cleve Paolillo, RVS 05/22/2013, 10:53 AM

## 2013-05-22 NOTE — Discharge Summary (Signed)
[facility] Discharge Summary  Patient name: Shawn Schwartz Medical record number: [id number] Date of birth: 12-13-1960 Age: 53 y.o. Gender: male Date of Admission: 05/21/2013  Date of Discharge: 05/22/2013  Admitting Physician: Dickie La, MD  Primary Care Provider: Christa See, MD Consultants: none  Indication for Hospitalization:   Discharge Diagnoses/Problem List:  Suspected TIA Pre-diabetes Hypertension GERD  Disposition: home  Discharge Condition: stable  Brief Hospital Course:  ANTONIOUS OMAHONEY is a 53 y.o. male admitted for TIA, symptoms with left arm and leg weakness, dysarthria. PMH significant for pre-diabetes, HTN, GERD. Patient's symptoms had resolved by presentation to ED. Initial workup with normal labs, EKG NSR, CT head negative for acute bleed or stroke but did show premature atrophy. Additional TIA workup with normal echo (EF 55-60%), normal carotid doppler, normal MRI/MRA, A1c 6.4%, lipid panel with HDL 34, LDL 90 while on statin. Patient had no recurrence of symptoms and stable for discharge  Issues for Follow Up:  1. Pre-diabetes: will need further counseling and discussion about lifestyle modifications, would likely do well with starting metformin now but will leave this decision to PCP 2. Statin therapy: ASCVD risk score 5.9%, 11.1% with diabetes. Continued on lovastatin but may need therapy changed if progresses to diabetes. 3. Tooth pain: plain films did not demonstrate right sided abscess (where pain was), but did show some lucency of lower left molars.   Significant Procedures: none  Significant Labs and Imaging:   Recent Labs Lab 05/21/13 1709  WBC 8.6  HGB 14.9  HCT 42.2  PLT 264    Recent Labs Lab 05/21/13 1709  NA 137  K 3.7  CL 99  CO2 26  GLUCOSE 139*  BUN 14  CREATININE 0.94  CALCIUM 9.5   Hemoglobin A1c: 6.4  Lipid Panel     Component Value Date/Time   CHOL 156 05/22/2013 0410   TRIG 159*  05/22/2013 0410   HDL 34* 05/22/2013 0410   CHOLHDL 4.6 05/22/2013 0410   VLDL 32 05/22/2013 0410   LDLCALC 90 05/22/2013 0410   Dg Orthopantogram  05/22/2013   CLINICAL DATA:  Infected wisdom tooth.  EXAM: ORTHOPANTOGRAM/PANORAMIC  COMPARISON:  CT HEAD W/O CM dated 05/21/2013  FINDINGS: Mandible appears intact, condyles are located. The left maxillary and mandible molars are absent. Trace periapical lucency about right posterior maxillary molar. Periapical lucencies about the mandible incisors. Multiple absent teeth.   IMPRESSION: Multiple periapical lucencies and absent teeth.   Electronically Signed   By: Elon Alas   On: 05/22/2013 00:28   Dg Chest 2 View  05/21/2013   CLINICAL DATA:  Left chest and arm pain.  Short of breath.  EXAM: CHEST  2 VIEW  COMPARISON:  DG CHEST 2 VIEW dated 05/18/2010  FINDINGS: Cardiopericardial silhouette within normal limits. Mediastinal contours normal. Trachea midline. No airspace disease or effusion.   IMPRESSION: No active cardiopulmonary disease.   Electronically Signed   By: Dereck Ligas M.D.   On: 05/21/2013 19:57   Ct Head Wo Contrast  05/21/2013   CLINICAL DATA:  Intermittent left-sided numbness. Hypertension. Tooth infection.  EXAM: CT HEAD WITHOUT CONTRAST  TECHNIQUE: Contiguous axial images were obtained from the base of the skull through the vertex without intravenous contrast.  COMPARISON:  None.  FINDINGS: No acute cortical infarct, hemorrhage, or mass lesion is present. The ventricles are of normal size. No significant extra-axial fluid collection is evident. The paranasal sinuses and mastoid air cells are clear. The osseous  skull is intact.   IMPRESSION: Negative CT of the head.   Electronically Signed   By: Lawrence Santiago M.D.   On: 05/21/2013 20:06   Mri Brain Without Contrast  05/22/2013   CLINICAL DATA:  Left-sided numbness. Speech difficulty. Symptoms have now resolved. Stroke risk factors include diabetes and hypertension.  EXAM: MRI HEAD  WITHOUT CONTRAST  MRA HEAD WITHOUT CONTRAST  TECHNIQUE: Multiplanar, multiecho pulse sequences of the brain and surrounding structures were obtained without intravenous contrast. Angiographic images of the head were obtained using MRA technique without contrast.  COMPARISON:  CT HEAD W/O CM dated 05/21/2013  FINDINGS: MRI HEAD FINDINGS  No evidence for acute infarction, hemorrhage, mass lesion, hydrocephalus, or extra-axial fluid. Slight premature cerebral and cerebellar atrophy. No significant white matter disease. Flow voids are maintained throughout the carotid, basilar, and vertebral arteries. There are no areas of chronic hemorrhage. Pituitary, pineal, and cerebellar tonsils unremarkable. No upper cervical lesions. Visualized calvarium, skull base, and upper cervical osseous structures unremarkable. Scalp and extracranial soft tissues, orbits, sinuses, and mastoids show no acute process.  MRA HEAD FINDINGS  Internal carotid arteries are widely patent. The basilar artery is widely patent with both vertebrals contributing. There is no proximal stenosis of the anterior, middle, or posterior cerebral arteries. No MCA or PCA branch occlusion. No intracranial aneurysm.   IMPRESSION: Slight premature for age cerebral and cerebellar atrophy. No significant white matter disease.  No evidence for acute stroke or intracranial mass lesion.  Unremarkable MRA of the intracranial circulation.   Electronically Signed   By: Rolla Flatten M.D.   On: 05/22/2013 07:55   Mr Jodene Nam Head/brain Wo Cm  05/22/2013   CLINICAL DATA:  Left-sided numbness. Speech difficulty. Symptoms have now resolved. Stroke risk factors include diabetes and hypertension.  EXAM: MRI HEAD WITHOUT CONTRAST  MRA HEAD WITHOUT CONTRAST  TECHNIQUE: Multiplanar, multiecho pulse sequences of the brain and surrounding structures were obtained without intravenous contrast. Angiographic images of the head were obtained using MRA technique without contrast.   COMPARISON:  CT HEAD W/O CM dated 05/21/2013  FINDINGS: MRI HEAD FINDINGS  No evidence for acute infarction, hemorrhage, mass lesion, hydrocephalus, or extra-axial fluid. Slight premature cerebral and cerebellar atrophy. No significant white matter disease. Flow voids are maintained throughout the carotid, basilar, and vertebral arteries. There are no areas of chronic hemorrhage. Pituitary, pineal, and cerebellar tonsils unremarkable. No upper cervical lesions. Visualized calvarium, skull base, and upper cervical osseous structures unremarkable. Scalp and extracranial soft tissues, orbits, sinuses, and mastoids show no acute process.  MRA HEAD FINDINGS  Internal carotid arteries are widely patent. The basilar artery is widely patent with both vertebrals contributing. There is no proximal stenosis of the anterior, middle, or posterior cerebral arteries. No MCA or PCA branch occlusion. No intracranial aneurysm.   IMPRESSION: Slight premature for age cerebral and cerebellar atrophy. No significant white matter disease.  No evidence for acute stroke or intracranial mass lesion.  Unremarkable MRA of the intracranial circulation.   Electronically Signed   By: Rolla Flatten M.D.   On: 05/22/2013 07:55     Results/Tests Pending at Time of Discharge: none  Discharge Medications:    Medication List         aspirin 81 MG chewable tablet  Chew 1 tablet (81 mg total) by mouth daily.     ibuprofen 200 MG tablet  Commonly known as:  ADVIL,MOTRIN  Take 800 mg by mouth every 8 (eight) hours as needed for mild pain.  lisinopril-hydrochlorothiazide 10-12.5 MG per tablet  Commonly known as:  PRINZIDE,ZESTORETIC  Take 2 tablets by mouth daily.     lovastatin 20 MG tablet  Commonly known as:  MEVACOR  Take 20 mg by mouth daily.     naproxen sodium 220 MG tablet  Commonly known as:  ANAPROX  Take 440 mg by mouth daily as needed (for pain).        Discharge Instructions: Please refer to Patient  Instructions section of EMR for full details.  Patient was counseled important signs and symptoms that should prompt return to medical care, changes in medications, dietary instructions, activity restrictions, and follow up appointments.   Follow-Up Appointments: Follow-up Information   Follow up with Rosemarie Ax, MD On 05/30/2013. (at 3:30pm, For hospital follow up)    Specialty:  Family Medicine   Contact information:   [place] [id number] [phone]       Tawanna Sat, MD 05/23/2013, 3:02 PM PGY-1, [place]

## 2013-05-27 NOTE — Discharge Summary (Signed)
Family Medicine Teaching Service  Discharge Note : Attending Macie Baum MD Pager [phone] Office [phone] I have seen and examined this patient, reviewed their chart and discussed discharge planning wit the resident at the time of discharge. I agree with the discharge plan as above.  

## 2013-05-29 DIAGNOSIS — I639 Cerebral infarction, unspecified: Secondary | ICD-10-CM

## 2013-05-29 HISTORY — DX: Cerebral infarction, unspecified: I63.9

## 2013-05-30 ENCOUNTER — Ambulatory Visit (INDEPENDENT_AMBULATORY_CARE_PROVIDER_SITE_OTHER): Payer: 59 | Admitting: Family Medicine

## 2013-05-30 ENCOUNTER — Encounter: Payer: Self-pay | Admitting: Family Medicine

## 2013-05-30 VITALS — BP 144/94 | HR 66 | Temp 97.6°F | Ht 72.0 in | Wt 233.0 lb

## 2013-05-30 DIAGNOSIS — G459 Transient cerebral ischemic attack, unspecified: Secondary | ICD-10-CM

## 2013-05-30 DIAGNOSIS — E119 Type 2 diabetes mellitus without complications: Secondary | ICD-10-CM

## 2013-05-30 NOTE — Patient Instructions (Signed)
Thank you for coming in,   I think that you look well since getting out of the hospital. Just watch your blood pressure and if you measure it, please bring those the next time you see Dr. Venetia Maxon.   Continue to take the antibiotics prescribed by your dentis.   Please try to diet and exercise the best you can as this well help reduce your blood sugars.   Please follow up with Dr. Venetia Maxon in the future regarding the need to start any medications for you blood sugar.    Please feel free to call with any questions or concerns at any time, at [phone]. --Dr. Raeford Razor  Diet Recommendations for Diabetes   Starchy (carb) foods include: Bread, rice, pasta, potatoes, corn, crackers, bagels, muffins, all baked goods.   Protein foods include: Meat, fish, poultry, eggs, dairy foods, and beans such as pinto and kidney beans (beans also provide carbohydrate).   1. Eat at least 3 meals and 1-2 snacks per day. Never go more than 4-5 hours while awake without eating.  2. Limit starchy foods to TWO per meal and ONE per snack. ONE portion of a starchy  food is equal to the following:   - ONE slice of bread (or its equivalent, such as half of a hamburger bun).   - 1/2 cup of a "scoopable" starchy food such as potatoes or rice.   - 1 OUNCE (28 grams) of starchy snack foods such as crackers or pretzels (look on label).   - 15 grams of carbohydrate as shown on food label.  3. Both lunch and dinner should include a protein food, a carb food, and vegetables.   - Obtain twice as many veg's as protein or carbohydrate foods for both lunch and dinner.   - Try to keep frozen veg's on hand for a quick vegetable serving.     - Fresh or frozen veg's are best.  4. Breakfast should always include protein.

## 2013-05-31 ENCOUNTER — Encounter: Payer: Self-pay | Admitting: Family Medicine

## 2013-05-31 NOTE — Assessment & Plan Note (Signed)
Hospital f/u for TIA. He is compliant with his medications and reports no further symptoms.  - continue medications.  - f/u lipid panel in one year.

## 2013-05-31 NOTE — Progress Notes (Signed)
   Subjective:    Patient ID: ISSIAC JAMAR, male    DOB: [birth year], 53 y.o.   MRN: [id number]  HPI  Shawn Schwartz is here for hospital f/u.   Patient denies any TIA type symptoms since leaving the hospital. He is compliant with his medications. He still reports feeling fatigued.  He denies any chest pain, paresthesia, or weakness.    Patient has not never been diagnosed with Diabetes previously. Hgb A1c noticed to be elevated from previous.  He denies any polyuria or polydipsia.  He works as a Presenter, broadcasting.   Patient has seen a dentist. He was prescribed amoxicillin and tylenol #3. He still reports tooth pain.  He denies any fever, chills or swelling of his face. He is scheduled for surgery in April.     Current Outpatient Prescriptions on File Prior to Visit  Medication Sig Dispense Refill  . aspirin 81 MG chewable tablet Chew 1 tablet (81 mg total) by mouth daily.  30 tablet  11  . lisinopril-hydrochlorothiazide (PRINZIDE,ZESTORETIC) 10-12.5 MG per tablet Take 2 tablets by mouth daily.       Marland Kitchen lovastatin (MEVACOR) 20 MG tablet Take 20 mg by mouth daily.      . naproxen sodium (ANAPROX) 220 MG tablet Take 440 mg by mouth daily as needed (for pain).      Marland Kitchen ibuprofen (ADVIL,MOTRIN) 200 MG tablet Take 800 mg by mouth every 8 (eight) hours as needed for mild pain.       No current facility-administered medications on file prior to visit.    Review of Systems See HPI     Objective:   Physical Exam BP 144/94  Pulse 66  Temp(Src) 97.6 F (36.4 C) (Oral)  Ht 6' (1.829 m)  Wt 233 lb (105.688 kg)  BMI 31.59 kg/m2 Gen: NAD, alert, cooperative with exam, well-appearing, obese  HEENT: no facial swelling,  CV: RRR, good S1/S2, no murmur, no edema, capillary refill brisk  Resp: CTABL, no wheezes, non-labored MSK: 5/5 strength in upper and lower extremities.  Neuro: no gross deficits.  Psych: good insight, alert and oriented        Assessment & Plan:

## 2013-05-31 NOTE — Assessment & Plan Note (Signed)
Currently no medications. Hgb A1c 6.4. He is interested in trying diet and exercise in treatment.  - will allow PCP and patient to determine if starting medication in the future.   - f/u in three months.

## 2013-06-03 ENCOUNTER — Telehealth: Payer: Self-pay | Admitting: Family Medicine

## 2013-06-03 NOTE — Telephone Encounter (Signed)
Pt brought in forms for FMLA to be completed He will pick them up when ready

## 2013-06-04 NOTE — Telephone Encounter (Signed)
I placed the FMLA form in PCP's box (pt may need appt?) Waiting for response from PCP. Thanks. Javier Glazier, Gerrit Heck

## 2013-06-04 NOTE — Telephone Encounter (Signed)
It depends on why he needs the forms completed -- it might be easier for him to come in for an appointment just to make sure. Please call him to ask if he can come in. If he can't, I'll fill the form out to best of my ability. If he can, please have him schedule an appointment. Thanks! --CMS

## 2013-06-04 NOTE — Telephone Encounter (Signed)
LM on identifiable VM for patient to please schedule an appt with his PCP (only), Dr. Venetia Maxon to have FMLA forms completed.  Brie Eppard, Loralyn Fresh[place], [place]

## 2013-06-12 ENCOUNTER — Encounter: Payer: Self-pay | Admitting: Family Medicine

## 2013-06-12 ENCOUNTER — Ambulatory Visit (INDEPENDENT_AMBULATORY_CARE_PROVIDER_SITE_OTHER): Payer: 59 | Admitting: Family Medicine

## 2013-06-12 VITALS — BP 140/100 | HR 65 | Ht 72.0 in | Wt 232.4 lb

## 2013-06-12 DIAGNOSIS — R519 Headache, unspecified: Secondary | ICD-10-CM | POA: Insufficient documentation

## 2013-06-12 DIAGNOSIS — G459 Transient cerebral ischemic attack, unspecified: Secondary | ICD-10-CM

## 2013-06-12 DIAGNOSIS — I10 Essential (primary) hypertension: Secondary | ICD-10-CM

## 2013-06-12 DIAGNOSIS — R51 Headache: Secondary | ICD-10-CM

## 2013-06-12 MED ORDER — AMLODIPINE BESYLATE 5 MG PO TABS: 5.0000 mg | ORAL_TABLET | Freq: Every day | ORAL | Status: DC

## 2013-06-12 NOTE — Assessment & Plan Note (Signed)
A: Remains uncontrolled despite lisinopril-HCTZ 10-12.5 x2 pills PO daily, and with persistent headaches, as well as recent TIA. Pt also has had numerous life stressors recently (see previous progress notes), besides the TIA and hospitalization. No current other new / recurrent neuro symptoms, but very concerning that BP remains elevated.  P: Continue lisinopril-HCTZ (plan to change Rx to 20-25 mg tablets once current Rx is out). Adding Norvasc 5 today. F/u in 2 weeks for BP check, then with me in 2 months or earlier if BP remains elevated, then. Otherwise f/u as needed.

## 2013-06-12 NOTE — Patient Instructions (Signed)
Front desk: Please schedule Mr. Donaldson a nurse visit for blood pressure check in 2 weeks, then he can follow up with me in a few months.  Thank you for coming in, today!  I think your headaches might be related to blood pressure. I want to get it down, regardless, since you had the TIA.  I want you to keep taking the lisinopril-HCTZ (two pills a day). Once you are out of those, call me, and I'll change it so you can take 1 a day. I want you to START taking a medicine called amlodipine (Norvasc) 5 mg. Take ONE of these once a day.  The biggest / most common side effect is ankle swelling. If you take it and can't tolerate it for some reason, we'll try something different.  Come back in about 2 weeks for a blood pressure check (nurse visit). Come back to see me in about 2-3 months or sooner if needed. Please feel free to call with any questions or concerns at any time, at [phone]. --Dr. Venetia Maxon

## 2013-06-12 NOTE — Telephone Encounter (Signed)
Forms completed in office visit, today. See that office visit note. Original forms given to patient, copies made and to be scanned. --CMS

## 2013-06-12 NOTE — Assessment & Plan Note (Signed)
Persistent since recent dental work and TIA, though also with persistently elevated BP (to which pt attributes some of the headache). No red flags, no aura; uncertain if this represents migraine or not. Plan to continue OTC analgesics and to improve BP control (see separate problem list note). Advised continued f/u adherence to instructions from dentist, as well. F/u PRN. Reviewed red flags that could indicate another TIA or stroke.

## 2013-06-12 NOTE — Progress Notes (Addendum)
   Subjective:    Patient ID: Shawn Schwartz, male    DOB: Apr 12, 1960, 53 y.o.   MRN: [id number]  HPI: Pt presents to clinic to complete FMLA paperwork for recent TIA and to discuss headaches for about 2 weeks.  TIA - see recent progress notes, manifested by left arm / leg weakness and dysarthria, all of which have resolved and not recurred - reports compliance with BP medication (lisinopril-HCTZ), daily ASA, Mevacor - TIA occurred 3/24, pt admitted overnight and discharged 3/25; observing for symptoms / BP control for the past two weeks (see recent outpt note from Dr. Raeford Razor)  Headache - present for about two weeks, mostly frontal - persistent since TIA recently, has been worse since having tooth pulled - sensation of pressure, sometimes present in the morning on waking - some relief with hydrocodone from the dentist and with ibuprofen - states his BP has been elevated at recent visits, but reports compliance with lisinopril-HCTZ  Review of Systems: As above. Pt denies chest pain, SOB, LE swelling. Describes occasionally feeling "winded" and does endorse feeling "tired" and "worn out" often.     Objective:   Physical Exam BP 140/100  Pulse 65  Ht 6' (1.829 m)  Wt 232 lb 6.4 oz (105.416 kg)  BMI 31.51 kg/m2 Gen: well-appearing adult male in NAD HEENT: [place]/AT, PERRLA, EOMI, TM's clear bilaterally  No dysarthria or facial droop  Some slight sinus tenderness (maxillary) Neuro: alert / oriented, no gross focal deficit  Normal gait / station, stands / sits / ambulates without assistance  Strength 5/5 in both upper and lower extremities and in grip, bilaterally Cardio: RRR, no murmur appreciated Pulm: CTAB, no wheezes appreciated Ext: moves all ext equally, no LE edema     Assessment & Plan:

## 2013-06-12 NOTE — Assessment & Plan Note (Addendum)
Resolved without recurrence, though still with headaches and BP elevated. Continue current meds; see other problem list notes. FMLA paperwork completed, today and to be scanned.

## 2013-06-28 ENCOUNTER — Emergency Department (HOSPITAL_COMMUNITY): Payer: 59

## 2013-06-28 ENCOUNTER — Encounter (HOSPITAL_COMMUNITY): Payer: Self-pay | Admitting: Emergency Medicine

## 2013-06-28 ENCOUNTER — Emergency Department (HOSPITAL_COMMUNITY)
Admission: EM | Admit: 2013-06-28 | Discharge: 2013-06-28 | Disposition: A | Discharge status: Home / Self Care | Payer: 59 | Attending: Emergency Medicine | Admitting: Emergency Medicine

## 2013-06-28 DIAGNOSIS — Z79899 Other long term (current) drug therapy: Secondary | ICD-10-CM | POA: Insufficient documentation

## 2013-06-28 DIAGNOSIS — R109 Unspecified abdominal pain: Secondary | ICD-10-CM | POA: Insufficient documentation

## 2013-06-28 DIAGNOSIS — Z7982 Long term (current) use of aspirin: Secondary | ICD-10-CM | POA: Insufficient documentation

## 2013-06-28 DIAGNOSIS — M545 Low back pain, unspecified: Secondary | ICD-10-CM | POA: Insufficient documentation

## 2013-06-28 DIAGNOSIS — R197 Diarrhea, unspecified: Secondary | ICD-10-CM | POA: Insufficient documentation

## 2013-06-28 DIAGNOSIS — E669 Obesity, unspecified: Secondary | ICD-10-CM | POA: Insufficient documentation

## 2013-06-28 DIAGNOSIS — Z8719 Personal history of other diseases of the digestive system: Secondary | ICD-10-CM | POA: Insufficient documentation

## 2013-06-28 DIAGNOSIS — IMO0002 Reserved for concepts with insufficient information to code with codable children: Secondary | ICD-10-CM | POA: Insufficient documentation

## 2013-06-28 DIAGNOSIS — E785 Hyperlipidemia, unspecified: Secondary | ICD-10-CM | POA: Insufficient documentation

## 2013-06-28 DIAGNOSIS — M5410 Radiculopathy, site unspecified: Secondary | ICD-10-CM

## 2013-06-28 DIAGNOSIS — I10 Essential (primary) hypertension: Secondary | ICD-10-CM | POA: Insufficient documentation

## 2013-06-28 DIAGNOSIS — Z8673 Personal history of transient ischemic attack (TIA), and cerebral infarction without residual deficits: Secondary | ICD-10-CM | POA: Insufficient documentation

## 2013-06-28 HISTORY — DX: Cerebral infarction, unspecified: I63.9

## 2013-06-28 LAB — COMPREHENSIVE METABOLIC PANEL
ALT: 22 U/L (ref 0–53)
AST: 22 U/L (ref 0–37)
Albumin: 3.9 g/dL (ref 3.5–5.2)
Alkaline Phosphatase: 96 U/L (ref 39–117)
BUN: 15 mg/dL (ref 6–23)
CALCIUM: 9.3 mg/dL (ref 8.4–10.5)
CO2: 25 mEq/L (ref 19–32)
CREATININE: 0.9 mg/dL (ref 0.50–1.35)
Chloride: 99 mEq/L (ref 96–112)
GFR calc Af Amer: >90 mL/min (ref >90)
GFR calc non Af Amer: >90 mL/min (ref >90)
Glucose, Bld: 100 mg/dL — ABNORMAL HIGH (ref 70–99)
Potassium: 4.2 mEq/L (ref 3.7–5.3)
SODIUM: 139 meq/L (ref 137–147)
Total Bilirubin: 0.4 mg/dL (ref 0.3–1.2)
Total Protein: 7.7 g/dL (ref 6.0–8.3)

## 2013-06-28 LAB — URINALYSIS, ROUTINE W REFLEX MICROSCOPIC
BILIRUBIN URINE: NEGATIVE
GLUCOSE, UA: NEGATIVE mg/dL
HGB URINE DIPSTICK: NEGATIVE
Ketones, ur: NEGATIVE mg/dL
Leukocytes, UA: NEGATIVE
Nitrite: NEGATIVE
PROTEIN: NEGATIVE mg/dL
Specific Gravity, Urine: 1.018 (ref 1.005–1.030)
Urobilinogen, UA: 0.2 mg/dL (ref 0.0–1.0)
pH: 5.5 (ref 5.0–8.0)

## 2013-06-28 LAB — CBC WITH DIFFERENTIAL/PLATELET
Basophils Absolute: 0 10*3/uL (ref 0.0–0.1)
Basophils Relative: 0 % (ref 0–1)
EOS ABS: 0.1 10*3/uL (ref 0.0–0.7)
EOS PCT: 1 % (ref 0–5)
HCT: 42.3 % (ref 39.0–52.0)
HEMOGLOBIN: 14.8 g/dL (ref 13.0–17.0)
Lymphocytes Relative: 18 % (ref 12–46)
Lymphs Abs: 2.4 10*3/uL (ref 0.7–4.0)
MCH: 29 pg (ref 26.0–34.0)
MCHC: 35 g/dL (ref 30.0–36.0)
MCV: 82.9 fL (ref 78.0–100.0)
MONO ABS: 0.9 10*3/uL (ref 0.1–1.0)
MONOS PCT: 7 % (ref 3–12)
NEUTROS PCT: 74 % (ref 43–77)
Neutro Abs: 10 10*3/uL — ABNORMAL HIGH (ref 1.7–7.7)
Platelets: 269 10*3/uL (ref 150–400)
RBC: 5.1 MIL/uL (ref 4.22–5.81)
RDW: 12.4 % (ref 11.5–15.5)
WBC: 13.5 10*3/uL — ABNORMAL HIGH (ref 4.0–10.5)

## 2013-06-28 MED ORDER — KETOROLAC TROMETHAMINE 60 MG/2ML IM SOLN
60.0000 mg | Freq: Once | INTRAMUSCULAR | Status: AC
  Administered 2013-06-28: 60 mg via INTRAMUSCULAR
  Filled 2013-06-28: qty 2

## 2013-06-28 NOTE — ED Notes (Signed)
Pt c/o R lower back pain that radiates to inner thigh.  Pain increases when he moves.  Pain sometimes is so intense it makes him nauseated.  Pain is constant./  Pt also complains that every time he eats he feels like he has to have a bm immediately.  For the past 2 days he c/o rectal pain and abdominal burning with bowel movements.

## 2013-06-28 NOTE — ED Notes (Signed)
Patient transported to CT 

## 2013-06-28 NOTE — ED Provider Notes (Signed)
CSN: [id number]     Arrival date & time 06/28/13  1625 History   First MD Initiated Contact with Patient 06/28/13 1804     Chief Complaint  Patient presents with  . Hip Pain  . Diarrhea     (Consider location/radiation/quality/duration/timing/severity/associated sxs/prior Treatment) HPI 53 year old male who comes in today complaining of a several day history of right flank pain radiating from the right low back to the right groin and upper leg area. He states it is improved with over-the-counter pain medicine. He denies any recent injury. It does not increase with movement. He states it is worse at night. He describes the pain as throbbing at night. It is sharp during the day. When it began it was not constant in nature but has become constant over the past 24 hours. He denies any urinary tract infection symptoms or hematuria appear Past Medical History  Diagnosis Date  . Diabetes mellitus     "borderline, controlled with diet"  . Hyperlipidemia   . Hypertension   . GERD (gastroesophageal reflux disease)     sporadic; occasional Prilosec  . Stroke 06/30/13    tia   Past Surgical History  Procedure Laterality Date  . Hernia repair  07/01/2003  . Cholecystectomy  2008/06/30   Family History  Problem Relation Age of Onset  . Heart disease Mother     deceased, MI 48  . Hypertension Mother   . Heart attack Mother   . Cancer Father     leukemia; died 30-Jun-2001  . Hypertension Father   . Depression Brother   . Diabetes Brother   . Hyperlipidemia Brother   . Hypertension Brother   . Early death Brother     "shortly after birth"   History  Substance Use Topics  . Smoking status: Never Smoker   . Smokeless tobacco: Never Used  . Alcohol Use: Yes     Comment: "rare wine cooler or a few beers a month"    Review of Systems  All other systems reviewed and are negative.     Allergies  Review of patient's allergies indicates no known allergies.  Home Medications   Prior to Admission  medications   Medication Sig Start Date End Date Taking? Authorizing Provider  amLODipine (NORVASC) 5 MG tablet Take 1 tablet (5 mg total) by mouth daily. 06/12/13  Yes Sharon Mt Street, MD  aspirin 81 MG chewable tablet Chew 1 tablet (81 mg total) by mouth daily. 05/22/13  Yes Tawanna Sat, MD  ibuprofen (ADVIL,MOTRIN) 200 MG tablet Take 800 mg by mouth every 8 (eight) hours as needed for mild pain.   Yes Historical Provider, MD  lisinopril-hydrochlorothiazide (PRINZIDE,ZESTORETIC) 10-12.5 MG per tablet Take 2 tablets by mouth daily.  09/27/12  Yes Sharon Mt Street, MD  lovastatin (MEVACOR) 20 MG tablet Take 20 mg by mouth daily.   Yes Historical Provider, MD   BP 127/93  Pulse 78  Temp(Src) 98.2 F (36.8 C) (Oral)  Resp 16  Ht 6' (1.829 m)  Wt 231 lb (104.781 kg)  BMI 31.32 kg/m2  SpO2 95% Physical Exam  Nursing note and vitals reviewed. Constitutional: He is oriented to person, place, and time. He appears well-developed and well-nourished.  Obese  HENT:  Head: Normocephalic and atraumatic.  Right Ear: External ear normal.  Nose: Nose normal.  Mouth/Throat: Oropharynx is clear and moist.  Eyes: Conjunctivae and EOM are normal. Pupils are equal, round, and reactive to light.  Neck: Normal range of motion.  Cardiovascular: Normal  rate, regular rhythm, normal heart sounds and intact distal pulses.   Pulmonary/Chest: Effort normal and breath sounds normal.  Abdominal: Soft. Bowel sounds are normal.  Musculoskeletal: Normal range of motion.  Neurological: He is alert and oriented to person, place, and time. He has normal reflexes. He displays normal reflexes. No cranial nerve deficit. He exhibits normal muscle tone. Coordination normal.  Skin: Skin is warm and dry.  Psychiatric: He has a normal mood and affect. His behavior is normal. Thought content normal.    ED Course  Procedures (including critical care time) Labs Review Labs Reviewed  COMPREHENSIVE METABOLIC PANEL -  Abnormal; Notable for the following:    Glucose, Bld 100 (*)    All other components within normal limits  CBC WITH DIFFERENTIAL - Abnormal; Notable for the following:    WBC 13.5 (*)    Neutro Abs 10.0 (*)    All other components within normal limits  URINALYSIS, ROUTINE W REFLEX MICROSCOPIC    Imaging Review No results found.   EKG Interpretation None      MDM   Final diagnoses:  Low back pain  Radiculopathy    CT without evidence of right ureteral stone but left intrarenal stone seen and patient advised.  Pain likely related to lumbar spine with radiculopathy but no acute neuro deficits.  Patient treated conservatively with nsaids and advised regarding follow up.     Shaune Pollack, MD 06/28/13 [phone]

## 2013-06-28 NOTE — ED Notes (Addendum)
Groin pain for three months, "liver and R kidney pain pt states" three weeks. Painful urination. Denies diarrhea. Some nausea from pain and headache. Urine straw and clear. RLQ pain

## 2013-06-28 NOTE — Discharge Instructions (Signed)
Sciatica Sciatica is pain, weakness, numbness, or tingling along your sciatic nerve. The nerve starts in the lower back and runs down the back of each leg. Nerve damage or certain conditions pinch or put pressure on the sciatic nerve. This causes the pain, weakness, and other discomforts of sciatica. HOME CARE   Only take medicine as told by your doctor.  Apply ice to the affected area for 20 minutes. Do this 3 4 times a day for the first 48 72 hours. Then try heat in the same way.  Exercise, stretch, or do your usual activities if these do not make your pain worse.  Go to physical therapy as told by your doctor.  Keep all doctor visits as told.  Do not wear high heels or shoes that are not supportive.  Get a firm mattress if your mattress is too soft to lessen pain and discomfort. GET HELP RIGHT AWAY IF:   You cannot control when you poop (bowel movement) or pee (urinate).  You have more weakness in your lower back, lower belly (pelvis), butt (buttocks), or legs.  You have redness or puffiness (swelling) of your back.  You have a burning feeling when you pee.  You have pain that gets worse when you lie down.  You have pain that wakes you from your sleep.  Your pain is worse than past pain.  Your pain lasts longer than 4 weeks.  You are suddenly losing weight without reason. MAKE SURE YOU:   Understand these instructions.  Will watch this condition.  Will get help right away if you are not doing well or get worse. Document Released: 11/24/2007 Document Revised: 08/16/2011 Document Reviewed: 06/26/2011 [facility] Patient Information 2014 [place]. Back Pain, Adult Low back pain is very common. About 1 in 5 people have back pain.The cause of low back pain is rarely dangerous. The pain often gets better over time.About half of people with a sudden onset of back pain feel better in just 2 weeks. About 8 in 10 people feel better by 6 weeks.  CAUSES Some common causes  of back pain include:  Strain of the muscles or ligaments supporting the spine.  Wear and tear (degeneration) of the spinal discs.  Arthritis.  Direct injury to the back. DIAGNOSIS Most of the time, the direct cause of low back pain is not known.However, back pain can be treated effectively even when the exact cause of the pain is unknown.Answering your caregiver's questions about your overall health and symptoms is one of the most accurate ways to make sure the cause of your pain is not dangerous. If your caregiver needs more information, he or she may order lab work or imaging tests (X-rays or MRIs).However, even if imaging tests show changes in your back, this usually does not require surgery. HOME CARE INSTRUCTIONS For many people, back pain returns.Since low back pain is rarely dangerous, it is often a condition that people can learn to [facility] their own.   Remain active. It is stressful on the back to sit or stand in one place. Do not sit, drive, or stand in one place for more than 30 minutes at a time. Take short walks on level surfaces as soon as pain allows.Try to increase the length of time you walk each day.  Do not stay in bed.Resting more than 1 or 2 days can delay your recovery.  Do not avoid exercise or work.Your body is made to move.It is not dangerous to be active, even though  your back may hurt.Your back will likely heal faster if you return to being active before your pain is gone.  Pay attention to your body when you bend and lift. Many people have less discomfortwhen lifting if they bend their knees, keep the load close to their bodies,and avoid twisting. Often, the most comfortable positions are those that put less stress on your recovering back.  Find a comfortable position to sleep. Use a firm mattress and lie on your side with your knees slightly bent. If you lie on your back, put a pillow under your knees.  Only take over-the-counter or prescription  medicines as directed by your caregiver. Over-the-counter medicines to reduce pain and inflammation are often the most helpful.Your caregiver may prescribe muscle relaxant drugs.These medicines help dull your pain so you can more quickly return to your normal activities and healthy exercise.  Put ice on the injured area.  Put ice in a plastic bag.  Place a towel between your skin and the bag.  Leave the ice on for 15-20 minutes, 03-04 times a day for the first 2 to 3 days. After that, ice and heat may be alternated to reduce pain and spasms.  Ask your caregiver about trying back exercises and gentle massage. This may be of some benefit.  Avoid feeling anxious or stressed.Stress increases muscle tension and can worsen back pain.It is important to recognize when you are anxious or stressed and learn ways to manage it.Exercise is a great option. SEEK MEDICAL CARE IF:  You have pain that is not relieved with rest or medicine.  You have pain that does not improve in 1 week.  You have new symptoms.  You are generally not feeling well. SEEK IMMEDIATE MEDICAL CARE IF:   You have pain that radiates from your back into your legs.  You develop new bowel or bladder control problems.  You have unusual weakness or numbness in your arms or legs.  You develop nausea or vomiting.  You develop abdominal pain.  You feel faint. Document Released: 02/14/2005 Document Revised: 08/16/2011 Document Reviewed: 07/05/2010 [facility] Patient Information 2014 [place], [place].

## 2013-07-04 ENCOUNTER — Ambulatory Visit: Payer: 59 | Admitting: Emergency Medicine

## 2013-08-15 ENCOUNTER — Ambulatory Visit: Payer: 59 | Admitting: Family Medicine

## 2013-09-24 ENCOUNTER — Other Ambulatory Visit: Payer: Self-pay | Admitting: Family Medicine

## 2013-10-31 ENCOUNTER — Other Ambulatory Visit: Payer: Self-pay | Admitting: Family Medicine

## 2013-10-31 MED ORDER — AMLODIPINE BESYLATE 5 MG PO TABS: 5.0000 mg | ORAL_TABLET | Freq: Every day | ORAL | Status: DC

## 2013-10-31 MED ORDER — LISINOPRIL-HYDROCHLOROTHIAZIDE 10-12.5 MG PO TABS: 2.0000 | ORAL_TABLET | Freq: Every day | ORAL | Status: DC

## 2014-05-01 ENCOUNTER — Encounter: Payer: Self-pay | Admitting: Family Medicine

## 2014-05-01 ENCOUNTER — Ambulatory Visit (INDEPENDENT_AMBULATORY_CARE_PROVIDER_SITE_OTHER): Payer: 59 | Admitting: Family Medicine

## 2014-05-01 ENCOUNTER — Ambulatory Visit (HOSPITAL_COMMUNITY)
Admission: RE | Admit: 2014-05-01 | Discharge: 2014-05-01 | Disposition: A | Discharge status: Home / Self Care | Payer: 59 | Source: Ambulatory Visit | Attending: Family Medicine | Admitting: Family Medicine

## 2014-05-01 VITALS — BP 116/78 | HR 102 | Temp 98.0°F | Wt 231.2 lb

## 2014-05-01 DIAGNOSIS — S90112A Contusion of left great toe without damage to nail, initial encounter: Secondary | ICD-10-CM | POA: Diagnosis not present

## 2014-05-01 DIAGNOSIS — W228XXA Striking against or struck by other objects, initial encounter: Secondary | ICD-10-CM | POA: Diagnosis not present

## 2014-05-01 DIAGNOSIS — M79675 Pain in left toe(s): Secondary | ICD-10-CM

## 2014-05-01 MED ORDER — TRAMADOL HCL 50 MG PO TABS: 50.0000 mg | ORAL_TABLET | Freq: Four times a day (QID) | ORAL | Status: DC | PRN

## 2014-05-01 NOTE — Progress Notes (Signed)
   Subjective:    Patient ID: Shawn Schwartz, male    DOB: Feb 12, 1961, 54 y.o.   MRN: [id number]  Seen for Same day visit for   CC: Left great toe pain  Patient reports left great toe pain 1 week after dropping a box on foot.  He has been using ibuprofen and Tylenol to help with pain.  Due to his diabetes went to be evaluated to make sure there is no signs of infection.  He denies any fevers, chills, redness, warmth, cuts, or ulcerations. 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   Review of Systems   See HPI for ROS. Objective:  BP 116/78 mmHg  Pulse 102  Temp(Src) 98 F (36.7 C) (Oral)  Wt 231 lb 3 oz (104.866 kg)  General: NAD Left great toe: Large periungual hematoma; mild tenderness to palpation.  Range of motion limited by swelling.  No obvious displacement.  No erythema or warmth or ulcerations.     Assessment & Plan:  See Problem List Documentation

## 2014-05-01 NOTE — Patient Instructions (Signed)
It was great seeing you today.   1. I will call you with your xray results 2. Take aleve 2 pills twice a day for the next two days. Aleve and Tylenol as needed after that 3. Take Ultram 50 to 100 mg as needed for pain every 6 hours   If you have any questions or concerns before then, please call the clinic at [phone].  Take Care,   Dr Phill Myron

## 2014-05-02 ENCOUNTER — Telehealth: Payer: Self-pay | Admitting: Family Medicine

## 2014-05-02 NOTE — Telephone Encounter (Signed)
Called and LVM that xrays were negative for fracture/dislocation. He can continue pain medication and supportive care as discussed.

## 2014-05-13 ENCOUNTER — Other Ambulatory Visit: Payer: Self-pay | Admitting: Family Medicine

## 2014-05-15 ENCOUNTER — Other Ambulatory Visit: Payer: Self-pay | Admitting: Family Medicine

## 2014-05-22 ENCOUNTER — Other Ambulatory Visit: Payer: Self-pay | Admitting: Family Medicine

## 2014-05-29 ENCOUNTER — Other Ambulatory Visit: Payer: Self-pay | Admitting: Family Medicine

## 2014-06-23 ENCOUNTER — Ambulatory Visit (INDEPENDENT_AMBULATORY_CARE_PROVIDER_SITE_OTHER): Payer: 59 | Admitting: Family Medicine

## 2014-06-23 ENCOUNTER — Encounter: Payer: Self-pay | Admitting: Family Medicine

## 2014-06-23 VITALS — BP 128/87 | HR 89 | Temp 97.9°F | Ht 72.0 in | Wt 235.0 lb

## 2014-06-23 DIAGNOSIS — E119 Type 2 diabetes mellitus without complications: Secondary | ICD-10-CM

## 2014-06-23 DIAGNOSIS — E785 Hyperlipidemia, unspecified: Secondary | ICD-10-CM | POA: Diagnosis not present

## 2014-06-23 DIAGNOSIS — K219 Gastro-esophageal reflux disease without esophagitis: Secondary | ICD-10-CM | POA: Diagnosis not present

## 2014-06-23 DIAGNOSIS — R1084 Generalized abdominal pain: Secondary | ICD-10-CM

## 2014-06-23 DIAGNOSIS — S90212A Contusion of left great toe with damage to nail, initial encounter: Secondary | ICD-10-CM | POA: Insufficient documentation

## 2014-06-23 DIAGNOSIS — R238 Other skin changes: Secondary | ICD-10-CM

## 2014-06-23 DIAGNOSIS — L989 Disorder of the skin and subcutaneous tissue, unspecified: Secondary | ICD-10-CM

## 2014-06-23 DIAGNOSIS — I10 Essential (primary) hypertension: Secondary | ICD-10-CM | POA: Diagnosis not present

## 2014-06-23 DIAGNOSIS — S90212D Contusion of left great toe with damage to nail, subsequent encounter: Secondary | ICD-10-CM

## 2014-06-23 LAB — COMPREHENSIVE METABOLIC PANEL
ALK PHOS: 97 U/L (ref 39–117)
ALT: 19 U/L (ref 0–53)
AST: 17 U/L (ref 0–37)
Albumin: 4.2 g/dL (ref 3.5–5.2)
BILIRUBIN TOTAL: 0.3 mg/dL (ref 0.2–1.2)
BUN: 16 mg/dL (ref 6–23)
CHLORIDE: 99 meq/L (ref 96–112)
CO2: 27 meq/L (ref 19–32)
Calcium: 9.4 mg/dL (ref 8.4–10.5)
Creat: 0.96 mg/dL (ref 0.50–1.35)
Glucose, Bld: 129 mg/dL — ABNORMAL HIGH (ref 70–99)
Potassium: 3.9 mEq/L (ref 3.5–5.3)
Sodium: 137 mEq/L (ref 135–145)
Total Protein: 7.4 g/dL (ref 6.0–8.3)

## 2014-06-23 LAB — LIPID PANEL
Cholesterol: 181 mg/dL (ref 0–200)
HDL: 36 mg/dL — ABNORMAL LOW (ref >=40)
LDL CALC: 125 mg/dL — AB (ref 0–99)
TRIGLYCERIDES: 102 mg/dL (ref <150)
Total CHOL/HDL Ratio: 5 Ratio
VLDL: 20 mg/dL (ref 0–40)

## 2014-06-23 LAB — POCT GLYCOSYLATED HEMOGLOBIN (HGB A1C): HEMOGLOBIN A1C: 6

## 2014-06-23 LAB — POCT H PYLORI SCREEN: H PYLORI SCREEN, POC: NEGATIVE

## 2014-06-23 MED ORDER — LOVASTATIN 20 MG PO TABS: 20.0000 mg | ORAL_TABLET | Freq: Every day | ORAL | Status: DC

## 2014-06-23 MED ORDER — OMEPRAZOLE 40 MG PO CPDR: 40.0000 mg | DELAYED_RELEASE_CAPSULE | Freq: Every day | ORAL | Status: DC

## 2014-06-23 MED ORDER — CLOTRIMAZOLE 1 % EX CREA: 1.0000 "application " | TOPICAL_CREAM | Freq: Two times a day (BID) | CUTANEOUS | Status: DC

## 2014-06-23 NOTE — Progress Notes (Signed)
   Subjective:    Patient ID: Shawn Schwartz, male    DOB: 12-06-1960, 54 y.o.   MRN: [id number]  HPI: Pt presents to clinic for follow-up of multiple issues.  Abdominal pain - present for about 1-2 months - has not taken any medications for it - describes a burning sensation in the middle of his stomach that sometimes spreads up into his chest - occasionally has nausea and discomfort even with fluids - also describes some sore throat with it but no real cough - does describe some tightness in a belt-like distribution after eating - all of these symptoms seem to be worse with almost any type of food; overall does seem worse with fried food - note pt is s/p cholecystectomy (not mentioned by pt until end of visit; see A&P)  Left great toe - seen about 1 month ago but xrays were normal; had a subungual hematoma - toenail is still dark, thickened but has not fallen off - pain is much better but does still have some pain if the toe is stepped on or hit on sometime - interested in having toenail removed and / or seeing podiatry  HTN - reports compliance with lisinopril-HCTZ 40-25 mg daily and amlodipine 5 mg daily - denies frank chest pain, SOB, LE swelling, or other side effects  HLD - reports compliant with Mevacor 20 mg daily and denies frank side effects  DM - per pt history, not on any medications - no specific   Review of Systems: As above. Has had some weight fluctuation, but has been unsuccessful at attempts to lose weight. Does have some intertriginous skin irritation / areas of redness in right axilla and under skin folds of abdomen.     Objective:   Physical Exam BP 128/87 mmHg  Pulse 89  Temp(Src) 97.9 F (36.6 C) (Oral)  Ht 6' (1.829 m)  Wt 235 lb (106.595 kg)  BMI 31.86 kg/m2 Gen: well-appearing adult male in NAD HEENT: [place]/AT, sclerae/conjunctivae clear, no lid lag, EOMI, PERRLA   MMM, posterior oropharynx clear, no cervical lymphadenopathy  neck supple with full ROM,  no masses appreciated; thyroid not enlarged  Cardio: RRR, no murmur appreciated; distal pulses intact/symmetric Pulm: CTAB, no wheezes, normal WOB  Abd: soft, nondistended, BS+, no HSM  Diffusely tender, worst in RUQ and epigastrium / superior to umbilicus  No frank peritoneal signs  Negative Murphy sign Ext: warm/well-perfused, no cyanosis/clubbing/edema  Left great toe with thickened / dysmorphic nail but no frank perionychia-type appearance  Nail darkened with appearance consistent with sub-ungual hematoma, not tense, mildly tender MSK: strength 5/5 in all four extremities, no frank joint deformity/effusion  normal ROM to all four extremities with no point muscle/bony tenderness in spine Neuro/Psych: alert/oriented, sensation grossly intact; normal gait/balance  mood euthymic with congruent affect Skin: right axilla and abdominal skin folds (left lateral abdomen) with mild areas irregular areas of redness, consistent with intertrigo  No frank skin breakdown, drainage, or excoriation of these areas     Assessment & Plan:  See problem list notes. Rx for clotrimazole cream and instructions for local skin care for intertriginous skin irritation, with f/u as needed.

## 2014-06-23 NOTE — Patient Instructions (Addendum)
Thank you for coming in, today!  We talked about several things.  Keep taking your blood pressure medications, aspirin, and cholesterol medicine. I called in a refill for your cholesterol medicine, today. I will check some basic lab work and call you or write you a letter with the results.  For your belly pain: - This could be an ulcer, gastritis / reflux, an infection called H.pylori that can cause ulcers, or gallbladder issues - It could be a combination of the above - I will check your blood for evidence for H.pylori infection - I will order an ultrasound to look for problems with your gallbladder - In the meantime, take a medicine called omeprazole to help reduce the amount of acid in your stomach - You can also take Zantac (ranitidine) over the counter, twice a day, as needed - Depending on what your blood and ultrasound results are, I'll let you know what if anything else needs to be done.  I will refer you to podiatry for your toe.  For your ears, use Colace drops over the counter, or hydrogen peroxide drops, to soften the earwax. Use a few drops in each ear, let it sit for several minutes, then shower and let it rinse out. Do this several times per week, as needed.  Come back to see me in about 1 month, or sooner if needed. Please feel free to call with any questions or concerns at any time, at [phone]. --Dr. Venetia Maxon

## 2014-06-24 ENCOUNTER — Encounter: Payer: Self-pay | Admitting: Family Medicine

## 2014-06-24 ENCOUNTER — Telehealth: Payer: Self-pay | Admitting: Family Medicine

## 2014-06-24 NOTE — Telephone Encounter (Signed)
Called pt to discuss lab results. Overall unimpressive labs; A1c in pre-diabetic range, not on any medications, HDL slightly low, LDL slightly high. H.pylori negative. Working diagnosis gastritis for abdominal pain. See office note 4/25 for full details.  Plan: Continue PPI +/- H2 blocker for at least 6 weeks, obtain abdominal US, then f/u after that. Pt voices understanding. --CMS

## 2014-06-24 NOTE — Assessment & Plan Note (Signed)
A: No medications, currently, with A1c 6.0 (down from 6.4 last year). Overall no frank symptoms; given abdominal pain, possible component of gastroparesis, but strongly doubt this given no long-standing history of frankly poorly-controlled DM.  P: Continue diet control. Strongly encouraged improved dietary habits and weight loss, especially in light of likely gastritis. F/u as needed; likely spot-check A1c every 6-12 months unless frank symptoms arise or sharp upward trend noted.

## 2014-06-24 NOTE — Assessment & Plan Note (Addendum)
A: Reasonably controlled and definitely improved from last visit after TIA last year, compliant with lisinopril-HCTZ 20-25 mg daily (two 10-12.5 mg tablets daily) and Norvasc 5 mg daily. No frank symptoms / side effects. Kidney function normal. Mild elevation today, likely component of active abdominal pain.  P: Continue ACE-thiazide. Monitor routinely at f/u. Consider increase to lisinopril 40 mg daily (two 20-12.5 mg tablets) and / or Norvasc to 10 mg daily in the future, if needed, but expect this should improve with diet / exercise improvements as otherwise recommended, and would prefer this to pharmaceutical intervention, in any case.

## 2014-06-24 NOTE — Assessment & Plan Note (Signed)
A: Reports compliance with Mevacor though LDL still elevated, HDL still slightly low. Unable to afford higher-intensity statin, currently. No frank side effects, LFT's normal.  P: Continue Mevacor and counseled on good compliance. Strongly encouraged improved diet / exercise habits and weight loss. Consider high-intensity statin in the future if able to afford it and / or if diet and exercise changes are not helpful. F/u as needed.

## 2014-06-24 NOTE — Assessment & Plan Note (Signed)
S/p injury to toe about 1 month ago, with persistent pain, thickened toenail, and appearance consistent with sub-ungual hematoma without frank infection. Discussed various options with pt, including nail removal and referral to podiatry; feel pt would be better served with specialist intervention if necessary, given timeline, current appearance, and persistence of symptoms. Pt referred to podiatry.

## 2014-06-24 NOTE — Assessment & Plan Note (Signed)
A: Uncertain exact etiology; definitely reflux-type symptoms described and working diagnosis is GERD with gastritis, but also some concern for gallbladder pathology. However, after plan discussed (see below), pt states he is s/p cholecystectomy. H.pylori POC testing negative. No red flags to suggest need for urgent surgery or GI eval (no hematemesis / hemoptysis, vital sign instability, frank peritoneal signs, etc).  P: Abdominal US ordered, will proceed despite s/p cholecystectomy to eval for any other obvious intra-abdominal process. Rx for omeprazole 40 mg daily for at least 6 weeks, and pt counseled on proper use of PPI; suggested pt may also use Zantac BID either PRN or scheduled, per pt preference. Strongly advised reduced intake of heavy meals, fatty foods, spicy foods, etc, at least until symptoms improve / resolve. Strongly advised against heavy NSAID use. Will f/u after [place]; if no improvement or any frank worsening, may consider GI referral for further work-up (?endoscopy or similar testing).

## 2014-06-24 NOTE — Assessment & Plan Note (Signed)
A: Hx of mild, intermittent issues in the past, not consistently using / needing PPI. Now with persistent issues suggestive of frank gastritis.  P: See abdominal pain problem list note; PPI daily for at least 6 weeks, then re-eval.

## 2014-06-30 ENCOUNTER — Ambulatory Visit (HOSPITAL_COMMUNITY): Payer: 59

## 2014-07-15 ENCOUNTER — Ambulatory Visit: Payer: 59 | Admitting: Podiatry

## 2014-09-29 ENCOUNTER — Ambulatory Visit: Payer: 59 | Admitting: Family Medicine

## 2014-10-02 ENCOUNTER — Encounter (HOSPITAL_COMMUNITY): Payer: Self-pay | Admitting: Vascular Surgery

## 2014-10-02 ENCOUNTER — Emergency Department (HOSPITAL_COMMUNITY)
Admission: EM | Admit: 2014-10-02 | Discharge: 2014-10-03 | Disposition: A | Discharge status: Home / Self Care | Payer: Commercial Managed Care - HMO | Attending: Emergency Medicine | Admitting: Emergency Medicine

## 2014-10-02 DIAGNOSIS — K219 Gastro-esophageal reflux disease without esophagitis: Secondary | ICD-10-CM | POA: Insufficient documentation

## 2014-10-02 DIAGNOSIS — M1000 Idiopathic gout, unspecified site: Secondary | ICD-10-CM | POA: Insufficient documentation

## 2014-10-02 DIAGNOSIS — M109 Gout, unspecified: Secondary | ICD-10-CM

## 2014-10-02 DIAGNOSIS — E785 Hyperlipidemia, unspecified: Secondary | ICD-10-CM | POA: Diagnosis not present

## 2014-10-02 DIAGNOSIS — Z79899 Other long term (current) drug therapy: Secondary | ICD-10-CM | POA: Diagnosis not present

## 2014-10-02 DIAGNOSIS — Z8673 Personal history of transient ischemic attack (TIA), and cerebral infarction without residual deficits: Secondary | ICD-10-CM | POA: Insufficient documentation

## 2014-10-02 DIAGNOSIS — I10 Essential (primary) hypertension: Secondary | ICD-10-CM | POA: Diagnosis not present

## 2014-10-02 LAB — URINALYSIS, ROUTINE W REFLEX MICROSCOPIC
Glucose, UA: NEGATIVE mg/dL
Hgb urine dipstick: NEGATIVE
Ketones, ur: NEGATIVE mg/dL
Nitrite: NEGATIVE
PH: 5 (ref 5.0–8.0)
Protein, ur: NEGATIVE mg/dL
SPECIFIC GRAVITY, URINE: 1.019 (ref 1.005–1.030)
Urobilinogen, UA: 0.2 mg/dL (ref 0.0–1.0)

## 2014-10-02 LAB — COMPREHENSIVE METABOLIC PANEL
ALT: 18 U/L (ref 17–63)
ANION GAP: 11 (ref 5–15)
AST: 18 U/L (ref 15–41)
Albumin: 3.7 g/dL (ref 3.5–5.0)
Alkaline Phosphatase: 104 U/L (ref 38–126)
BUN: 10 mg/dL (ref 6–20)
CALCIUM: 9.3 mg/dL (ref 8.9–10.3)
CO2: 24 mmol/L (ref 22–32)
CREATININE: 1.16 mg/dL (ref 0.61–1.24)
Chloride: 100 mmol/L — ABNORMAL LOW (ref 101–111)
GFR calc non Af Amer: >60 mL/min (ref >60)
Glucose, Bld: 147 mg/dL — ABNORMAL HIGH (ref 65–99)
POTASSIUM: 3.4 mmol/L — AB (ref 3.5–5.1)
SODIUM: 135 mmol/L (ref 135–145)
Total Bilirubin: 0.5 mg/dL (ref 0.3–1.2)
Total Protein: 7.7 g/dL (ref 6.5–8.1)

## 2014-10-02 LAB — URINE MICROSCOPIC-ADD ON

## 2014-10-02 LAB — CBC
HCT: 39.7 % (ref 39.0–52.0)
Hemoglobin: 13.6 g/dL (ref 13.0–17.0)
MCH: 28.2 pg (ref 26.0–34.0)
MCHC: 34.3 g/dL (ref 30.0–36.0)
MCV: 82.4 fL (ref 78.0–100.0)
Platelets: 370 10*3/uL (ref 150–400)
RBC: 4.82 MIL/uL (ref 4.22–5.81)
RDW: 12.9 % (ref 11.5–15.5)
WBC: 11.3 10*3/uL — AB (ref 4.0–10.5)

## 2014-10-02 LAB — LIPASE, BLOOD: Lipase: 44 U/L (ref 22–51)

## 2014-10-02 NOTE — ED Notes (Addendum)
Pt reports to the ED for eval of left foot pain and swelling. He has hx of gout in that foot. He reports he cannot put a shoe on and weight bearing makes the pain worse. Pt also reports he has been having decreased PO intake and as soon as he eats he has to immediately have a BM. Pt also reports he was supposed to have a CT scan but has not been able to get it done yet. Denies any blood in his stools. He is also a diabetic. Pt A&Ox4, resp e/u, and skin warm and dry.

## 2014-10-03 MED ORDER — OXYCODONE-ACETAMINOPHEN 5-325 MG PO TABS: 1.0000 | ORAL_TABLET | ORAL | Status: DC | PRN

## 2014-10-03 MED ORDER — COLCHICINE 0.6 MG PO TABS: ORAL_TABLET | ORAL | Status: DC

## 2014-10-03 MED ORDER — COLCHICINE 0.6 MG PO TABS
1.2000 mg | ORAL_TABLET | Freq: Once | ORAL | Status: AC
  Administered 2014-10-03: 1.2 mg via ORAL
  Filled 2014-10-03: qty 2

## 2014-10-03 MED ORDER — OXYCODONE-ACETAMINOPHEN 5-325 MG PO TABS
2.0000 | ORAL_TABLET | Freq: Once | ORAL | Status: AC
  Administered 2014-10-03: 2 via ORAL
  Filled 2014-10-03: qty 2

## 2014-10-03 NOTE — Discharge Instructions (Signed)

## 2014-10-03 NOTE — ED Provider Notes (Addendum)
CSN: [id number]     Arrival date & time 10/02/14  06-03-2103 History  This chart was scribed for Noemi Chapel, MD by Hansel Feinstein, ED Scribe. This patient was seen in room AH01C/AH01C and the patient's care was started at 12:03 AM.      Chief Complaint  Patient presents with  . Gout   The history is provided by the patient. No language interpreter was used.    HPI Comments: Shawn Schwartz is a 54 y.o. male with Hx of DM, HLD, HTN, GERD who presents to the Emergency Department complaining of constant, moderate, sharp left foot pain and swelling onset 5 days ago. He states Hx of similar symptoms. He states that he has taken Tylenol, OTC herbal medication, ibuprofen and rested his foot with no relief. Pt notes that Sx are worsened with ambulation, weight bearing, touch. NKDA. He denies fever.   Past Medical History  Diagnosis Date  . Diabetes mellitus     "borderline, controlled with diet"  . Hyperlipidemia   . Hypertension   . GERD (gastroesophageal reflux disease)     sporadic; occasional Prilosec  . Stroke 05/2013    tia   Past Surgical History  Procedure Laterality Date  . Hernia repair  03-Jun-2003  . Cholecystectomy  06-02-2008   Family History  Problem Relation Age of Onset  . Heart disease Mother     deceased, MI 11  . Hypertension Mother   . Heart attack Mother   . Cancer Father     leukemia; died 06-02-2001  . Hypertension Father   . Depression Brother   . Diabetes Brother   . Hyperlipidemia Brother   . Hypertension Brother   . Early death Brother     "shortly after birth"   History  Substance Use Topics  . Smoking status: Never Smoker   . Smokeless tobacco: Never Used  . Alcohol Use: Yes     Comment: "rare wine cooler or a few beers a month"    Review of Systems  Constitutional: Negative for fever.  Musculoskeletal: Positive for joint swelling and arthralgias.   Allergies  Review of patient's allergies indicates no known allergies.  Home Medications   Prior to Admission  medications   Medication Sig Start Date End Date Taking? Authorizing Provider  amLODipine (NORVASC) 5 MG tablet TAKE 1 TABLET (5 MG TOTAL) BY MOUTH DAILY. 05/22/14   Sharon Mt Street, MD  clotrimazole (LOTRIMIN) 1 % cream Apply 1 application topically 2 (two) times daily. Twice daily every day until rash resolves, then once daily for an additional week. 06/23/14   [place], MD  colchicine 0.6 MG tablet Take 0.6mg  (one tablet) by mouth every 1-2 hours until one of the following occurs: 1.  The pain is gone 2.  The maximum dose has been given ( no more than 3 tabs in 3 hours or 10 tabs in 24 hours) 3.  The side effects outweight the benefits 10/03/14   Noemi Chapel, MD  [facility] ASPIRIN CHILD 81 MG chewable tablet CHEW 1 TABLET (81 MG TOTAL) BY MOUTH DAILY. Jun 03, 2014   [place], MD  ibuprofen (ADVIL,MOTRIN) 200 MG tablet Take 800 mg by mouth every 8 (eight) hours as needed for mild pain.    Historical Provider, MD  lisinopril-hydrochlorothiazide (PRINZIDE,ZESTORETIC) 10-12.5 MG per tablet TAKE 2 TABLETS BY MOUTH DAILY. 05/16/14   [place], MD  lovastatin (MEVACOR) 20 MG tablet Take 1 tablet (20 mg total) by mouth at bedtime. 06/23/14  Sharon Mt [address], MD  omeprazole (PRILOSEC) 40 MG capsule Take 1 capsule (40 mg total) by mouth daily. 06/23/14   [place], MD  oxyCODONE-acetaminophen (PERCOCET) 5-325 MG per tablet Take 1 tablet by mouth every 4 (four) hours as needed. 10/03/14   Noemi Chapel, MD  traMADol (ULTRAM) 50 MG tablet Take 1 tablet (50 mg total) by mouth every 6 (six) hours as needed. 05/01/14   Olam Idler, MD   BP 124/87 mmHg  Pulse 94  Temp(Src) 97.9 F (36.6 C) (Oral)  Resp 20  Ht 6' (1.829 m)  Wt 225 lb 14.4 oz (102.468 kg)  BMI 30.63 kg/m2  SpO2 100% Physical Exam  Constitutional: He appears well-developed and well-nourished.  HENT:  Head: Normocephalic and atraumatic.  Eyes: Conjunctivae are normal. Right eye exhibits no discharge.  Left eye exhibits no discharge.  Pulmonary/Chest: Effort normal. No respiratory distress.  Musculoskeletal: He exhibits tenderness ( ttp at the base of the 1st MTP on the L - redness, warmth, dec ROM of large toe.).  Compartments soft and ujoints supple other than 1st MTP on the L  Neurological: He is alert. Coordination normal.  Skin: Skin is warm and dry. No rash noted. He is not diaphoretic. No erythema.  Psychiatric: He has a normal mood and affect.  Nursing note and vitals reviewed.   ED Course  Procedures (including critical care time) DIAGNOSTIC STUDIES: Oxygen Saturation is 100% on RA, normal by my interpretation.    COORDINATION OF CARE: 12:06 AM Discussed treatment plan with pt at bedside and pt agreed to plan.   Labs Review Labs Reviewed  COMPREHENSIVE METABOLIC PANEL - Abnormal; Notable for the following:    Potassium 3.4 (*)    Chloride 100 (*)    Glucose, Bld 147 (*)    All other components within normal limits  CBC - Abnormal; Notable for the following:    WBC 11.3 (*)    All other components within normal limits  URINALYSIS, ROUTINE W REFLEX MICROSCOPIC (NOT AT [facility]) - Abnormal; Notable for the following:    Bilirubin Urine SMALL (*)    Leukocytes, UA SMALL (*)    All other components within normal limits  URINE MICROSCOPIC-ADD ON - Abnormal; Notable for the following:    Casts HYALINE CASTS (*)    All other components within normal limits  LIPASE, BLOOD    Imaging Review No results found.    MDM   Final diagnoses:  Acute gouty arthritis    The patient is well-appearing, he has podagra of the left MTP, there is no other acute findings to suggest infection or other concerns. Medications will be given as below. He has been given the warnings of when to stop using colchicine, his kidney function is normal, electrolytes are normal, urinalysis unremarkable, white blood cells slightly elevated but nonspecific. Patient expresses understanding to the treatment  plan and the indications for return.  Meds given in ED:  Medications  colchicine tablet 1.2 mg (not administered)  oxyCODONE-acetaminophen (PERCOCET/ROXICET) 5-325 MG per tablet 2 tablet (not administered)    New Prescriptions   COLCHICINE 0.6 MG TABLET    Take 0.6mg  (one tablet) by mouth every 1-2 hours until one of the following occurs: 1.  The pain is gone 2.  The maximum dose has been given ( no more than 3 tabs in 3 hours or 10 tabs in 24 hours) 3.  The side effects outweight the benefits   OXYCODONE-ACETAMINOPHEN (PERCOCET) 5-325 MG PER TABLET  Take 1 tablet by mouth every 4 (four) hours as needed.     I personally performed the services described in this documentation, which was scribed in my presence. The recorded information has been reviewed and is accurate.       Noemi Chapel, MD 10/03/14 [id number]  Noemi Chapel, MD 10/03/14 [phone]

## 2014-10-07 ENCOUNTER — Encounter: Payer: Self-pay | Admitting: Family Medicine

## 2014-10-07 ENCOUNTER — Ambulatory Visit (INDEPENDENT_AMBULATORY_CARE_PROVIDER_SITE_OTHER): Payer: Commercial Managed Care - HMO | Admitting: Family Medicine

## 2014-10-07 VITALS — BP 145/83 | HR 93 | Temp 98.1°F | Ht 72.0 in | Wt 222.8 lb

## 2014-10-07 DIAGNOSIS — M109 Gout, unspecified: Secondary | ICD-10-CM | POA: Insufficient documentation

## 2014-10-07 DIAGNOSIS — M10072 Idiopathic gout, left ankle and foot: Secondary | ICD-10-CM

## 2014-10-07 DIAGNOSIS — I10 Essential (primary) hypertension: Secondary | ICD-10-CM | POA: Diagnosis not present

## 2014-10-07 MED ORDER — COLCHICINE 0.6 MG PO TABS: 0.6000 mg | ORAL_TABLET | Freq: Two times a day (BID) | ORAL | Status: DC

## 2014-10-07 MED ORDER — PREDNISONE 20 MG PO TABS: ORAL_TABLET | ORAL | Status: DC

## 2014-10-07 MED ORDER — INDOMETHACIN 50 MG PO CAPS: 50.0000 mg | ORAL_CAPSULE | Freq: Three times a day (TID) | ORAL | Status: DC

## 2014-10-07 NOTE — Patient Instructions (Signed)
Dear Shawn Schwartz, Thank you for coming in to clinic today. It was good to see you!  1. For your Gout - Try Colchicine 0.6mg  take twice daily for 7 days. Also take Indomethicin (NSAID) 50mg  3 times daily with food. DO NOT TAKE IBUPROFEN / ALEVE as well. May continue Oxycodone 2. If you cannot afford this med or not helping, then may fill Prednisone. Take as instructed. 3. Keep foot elevated, may use ice as needed when less sensitive. 4. Out of work until Monday 5. If not improving, please come back within 1 week for close follow-up  Some important numbers from today's visit: BP 145/83 mmHg  Pulse 93  Temp(Src) 98.1 F (36.7 C) (Oral)  Ht 6' (1.829 m)  Wt 222 lb 12.8 oz (101.061 kg)  BMI 30.21 kg/m2  Results -  Please schedule a follow-up appointment with Dr. Parks Ranger in 3 to 4 weeks once Gout resolved. To Discuss High Blood Pressure / Diabetes follow-up.  If you have any other questions or concerns, please feel free to call the clinic to contact me. You may also schedule an earlier appointment if necessary.  However, if your symptoms get significantly worse, please go to the Emergency Department to seek immediate medical attention.  Shawn Schwartz, [place]

## 2014-10-07 NOTE — Progress Notes (Signed)
   Subjective:    Patient ID: PATRICIO POPWELL, male    DOB: [birth year], 54 y.o.   MRN: [id number]  HPI  LEFT GREAT TOE ACUTE GOUT FLARE / ED FOLLOW-UP - Recent course at [facility] 10/02/14 for acute left great toe gout. Symptoms onset approx 09/29/14, similar to previous gout episodes, pain with wt bearing and light touch. Not responding to ibuprofen, tylenol, rest.  - Symptoms started about 1 week ago, approx Thursday significant worsening within first 24-48 hours with Left great toe MTP swelling. Treated with Colchicine and Percocet - Today patient presents for ED follow-up. Initially only able to afford 24-48 hours worth of Colchicine at pharmacy, has been taking Percocet for pain, overall improved with reduced swelling but some persistent pain and redness. Additionally says he accidentally hit his foot against the bathtub and says made pain worse. Some difficulty putting shoe on due to pain. - Taking Percocet 3-4x daily with relief - Known chronic gout with several flares within 1 year, about x 2 within 6 months. No prior Uric Acid levels, not previously on chronic prophylaxis. - Denies any other joint redness / swelling pain, ulceration or drainage, fevers/chills, numbness, weakness  CHRONIC HTN: Reports - no concerns about BP. Does not check regularly at home Current Meds - Lisinopril-HCTZ  (10-12.5mg  x 2 tabs daily), Amlodipine 5mg  daily Reports good compliance, took meds today. Tolerating well, w/o complaints. Lifestyle - active as security guard Denies CP, dyspnea, HA, edema, dizziness / lightheadedness  I have reviewed and updated the following as appropriate: allergies and current medications  Social Hx: - Never smoker - Works as Presenter, broadcasting  Review of Systems  See above HPI    Objective:   Physical Exam  BP 145/83 mmHg  Pulse 93  Temp(Src) 98.1 F (36.7 C) (Oral)  Ht 6' (1.829 m)  Wt 222 lb 12.8 oz (101.061 kg)  BMI 30.21 kg/m2  Gen - well-appearing, comfortable except  left foot pain, NAD HEENT - MMM Heart - RRR, no murmurs heard Ext - Left Foot with mild erythema and edema localized to MTP / great toe medial aspect, slightly warm, moderate +TTP to light touch. Otherwise lower ext no edema and non-tender, no calf tenderness, peripheral pulses intact Skin - warm, dry, no rashes Neuro - awake, alert, limited weight bearing on left foot due to pain     Assessment & Plan:   See specific A&P problem list for details.

## 2014-10-07 NOTE — Assessment & Plan Note (Addendum)
Consistent with acute gouty arthritis flare (Left great toe, podagra), onset 8/1 some improvement with initial colchicine in ED, since persistent pain. No obvious injury or other complication. Neurovasc intact. - unclear exact trigger, at some increased risk with HCTZ  Plan: 1. Rx continued course Colchicine 0.6mg  PO BID for up to 3 days (or until resolution then daily to finish course), also added Indomethacin 50mg  PO TID x 7 days. DC other OTC NSAIDS (ibuprofen). Patient previously not able to afford colchicine for continued course. Now says should be able to afford. 2. Alternatively provided rx printed for Prednisone x 8 day course 40mg  for 4 then 20 mg for 4 days, if unable to afford colchicine, or not responding after 2-3 days therapy. Concern with Pre-DM and hyperglycemia. 3. Counseled on reducing food triggers, dietary mods 4. Consider future DC HCTZ increase risk gout flares 5. RTC 2-4 week follow-up gout, return criteria for sooner, with plans to re-evaluate for PreDM for routine follow-up 6. Note written out of work until Monday 8/15

## 2014-10-08 NOTE — Assessment & Plan Note (Signed)
Mildly elevated, overall moderately controlled currently not at goal < 140/90. However active pain with acute gout flare likely raising BP. No complications   Plan:  1. Continue current BP meds today 2. Treat acute gout flare 3. Last BMET 10/02/14, stable to mild elevated SCr 1.16, near prior baseline 0.9 to 1 4. Lifestyle Mods - Improve regular exercise, Dec salt intake, inc K+ rich vegs 5. Monitor BP at home or at drug store occasionally 6. Strongly consider DC HCTZ given several gout flares, may increase ACEi and Amlodipine alternatively. May also consider Ambulatory BP monitoring in future to check BPs when not presenting for variety of acute visits.

## 2014-10-27 ENCOUNTER — Other Ambulatory Visit: Payer: Self-pay | Admitting: Family Medicine

## 2014-10-27 DIAGNOSIS — I10 Essential (primary) hypertension: Secondary | ICD-10-CM

## 2014-10-29 ENCOUNTER — Ambulatory Visit: Payer: Commercial Managed Care - HMO | Admitting: Family Medicine

## 2015-04-21 ENCOUNTER — Ambulatory Visit (INDEPENDENT_AMBULATORY_CARE_PROVIDER_SITE_OTHER): Payer: Commercial Managed Care - HMO | Admitting: Family Medicine

## 2015-04-21 ENCOUNTER — Encounter: Payer: Self-pay | Admitting: Family Medicine

## 2015-04-21 VITALS — BP 160/98 | HR 80 | Temp 97.8°F | Wt 226.0 lb

## 2015-04-21 DIAGNOSIS — R350 Frequency of micturition: Secondary | ICD-10-CM | POA: Diagnosis not present

## 2015-04-21 DIAGNOSIS — M5442 Lumbago with sciatica, left side: Secondary | ICD-10-CM | POA: Diagnosis not present

## 2015-04-21 DIAGNOSIS — K219 Gastro-esophageal reflux disease without esophagitis: Secondary | ICD-10-CM

## 2015-04-21 DIAGNOSIS — R1084 Generalized abdominal pain: Secondary | ICD-10-CM

## 2015-04-21 DIAGNOSIS — M545 Low back pain, unspecified: Secondary | ICD-10-CM | POA: Insufficient documentation

## 2015-04-21 DIAGNOSIS — I10 Essential (primary) hypertension: Secondary | ICD-10-CM

## 2015-04-21 LAB — POCT URINALYSIS DIPSTICK
Bilirubin, UA: NEGATIVE
GLUCOSE UA: NEGATIVE
Ketones, UA: NEGATIVE
Leukocytes, UA: NEGATIVE
NITRITE UA: NEGATIVE
PROTEIN UA: NEGATIVE
SPEC GRAV UA: 1.02
UROBILINOGEN UA: 0.2
pH, UA: 5.5

## 2015-04-21 LAB — CBC WITH DIFFERENTIAL/PLATELET
Basophils Absolute: 0 10*3/uL (ref 0.0–0.1)
Basophils Relative: 0 % (ref 0–1)
EOS ABS: 0.1 10*3/uL (ref 0.0–0.7)
Eosinophils Relative: 1 % (ref 0–5)
HCT: 44.8 % (ref 39.0–52.0)
Hemoglobin: 15.5 g/dL (ref 13.0–17.0)
Lymphocytes Relative: 35 % (ref 12–46)
Lymphs Abs: 2.9 10*3/uL (ref 0.7–4.0)
MCH: 27.6 pg (ref 26.0–34.0)
MCHC: 34.6 g/dL (ref 30.0–36.0)
MCV: 79.9 fL (ref 78.0–100.0)
MONO ABS: 0.6 10*3/uL (ref 0.1–1.0)
MONOS PCT: 7 % (ref 3–12)
MPV: 9.4 fL (ref 8.6–12.4)
Neutro Abs: 4.7 10*3/uL (ref 1.7–7.7)
Neutrophils Relative %: 57 % (ref 43–77)
PLATELETS: 286 10*3/uL (ref 150–400)
RBC: 5.61 MIL/uL (ref 4.22–5.81)
RDW: 14.4 % (ref 11.5–15.5)
WBC: 8.3 10*3/uL (ref 4.0–10.5)

## 2015-04-21 LAB — GLUCOSE, CAPILLARY: Glucose-Capillary: 113 mg/dL — ABNORMAL HIGH (ref 65–99)

## 2015-04-21 LAB — POCT UA - MICROSCOPIC ONLY

## 2015-04-21 MED ORDER — OMEPRAZOLE 40 MG PO CPDR: 40.0000 mg | DELAYED_RELEASE_CAPSULE | Freq: Every day | ORAL | Status: DC

## 2015-04-21 MED ORDER — TRAMADOL HCL 50 MG PO TABS: 50.0000 mg | ORAL_TABLET | Freq: Three times a day (TID) | ORAL | Status: DC | PRN

## 2015-04-21 NOTE — Progress Notes (Signed)
Subjective:    Patient ID: Shawn Schwartz, male    DOB: Apr 14, 1960, 55 y.o.   MRN: [id number]  Shawn Schwartz is a 55 y.o. male presenting on 04/21/2015 for Abdominal Pain; Diarrhea; and Headache  HPI  ABDOMINAL PAIN, BACK PAIN, HA, DIARRHEA: - Reports constellation of symptoms for past 4-5 days with bilateral low back pain some radiating pain down left leg (constant worse with walking and bending forward), generalized abdominal pain seems to be upper quads and lower suprapubic region more, intermittent but some persistent discomfort worse with voiding, also urinary frequency with reduced amount voiding, without any dysuria or hematuria. Additionally reports diarrhea (loose stools, no blood) usually following eating about 2-3x daily. - Sick contacts with children having viral URI symptoms - Tried Ibuprofen 1-2 tabs 2-3 daily with some decent relief of abdominal pain - Out of work (Sat, Sun, Mon) - Last drink alcohol 2 weeks ago, none around time of onset - History of GERD, prior on omeprazole with improvement, symptoms were dyspepsia, heartburn and dysphagia, stopped med 10/2014, switched diet and done better - PMH Type 2 DM, HTN - Admits nausea without vomiting, some headache - Denies fever, chills, rash, chest pain, SOB, cough  Surgical Hx: s/p cholecystectomy  Social History  Substance Use Topics  . Smoking status: Never Smoker   . Smokeless tobacco: Never Used  . Alcohol Use: Yes     Comment: "rare wine cooler or a few beers a month"    Review of Systems Per HPI unless specifically indicated above     Objective:    BP 160/98 mmHg  Pulse 80  Temp(Src) 97.8 F (36.6 C) (Oral)  Wt 226 lb (102.513 kg)  Wt Readings from Last 3 Encounters:  04/21/15 226 lb (102.513 kg)  10/07/14 222 lb 12.8 oz (101.061 kg)  10/02/14 225 lb 14.4 oz (102.468 kg)    Physical Exam  Constitutional: He is oriented to person, place, and time. He appears well-developed and well-nourished. No distress.   Well-appearing, cooperative and some generalized discomfort  HENT:  Head: Normocephalic and atraumatic.  Mouth/Throat: Oropharynx is clear and moist.  Sinuses non-tender  Eyes: Conjunctivae and EOM are normal. Pupils are equal, round, and reactive to light. Right eye exhibits no discharge. Left eye exhibits no discharge.  Neck: Normal range of motion. Neck supple.  Cardiovascular: Normal rate, regular rhythm, normal heart sounds and intact distal pulses.   No murmur heard. Pulmonary/Chest: Effort normal and breath sounds normal. No respiratory distress. He has no wheezes. He has no rales.  Abdominal: Soft. Bowel sounds are normal. He exhibits distension (obese abdomen but mild distention upper quads bilateral). He exhibits no mass. There is tenderness (generalized tenderness upper quads, RUQ > LUQ, and suprapubic). There is no rebound and no guarding.  Musculoskeletal: He exhibits no edema.  Back Inspection: Normal, without deformity Palpation: Mild tenderness to palpation bilateral lower lumbar paraspinal, and mild Right mid lumbar not consistent with CVAT ROM: Full AROM, inc discomfort with forward flexion Special Testing: Seated SLR with tightness in Left leg but no radicular symptoms Strength: bilateral lower ext 5/5 Neurovascular: distally intact   Neurological: He is alert and oriented to person, place, and time.  Skin: Skin is warm and dry. No rash noted. He is not diaphoretic.  Nursing note and vitals reviewed.  Results for orders placed or performed in visit on 04/21/15  COMPLETE METABOLIC PANEL WITH GFR  Result Value Ref Range   Sodium 138 135 - 146 mmol/L  Potassium 4.0 3.5 - 5.3 mmol/L   Chloride 104 98 - 110 mmol/L   CO2 25 20 - 31 mmol/L   Glucose, Bld 112 (H) 65 - 99 mg/dL   BUN 15 7 - 25 mg/dL   Creat 0.93 0.70 - 1.33 mg/dL   Total Bilirubin 0.6 0.2 - 1.2 mg/dL   Alkaline Phosphatase 117 (H) 40 - 115 U/L   AST 16 10 - 35 U/L   ALT 16 9 - 46 U/L   Total Protein  7.8 6.1 - 8.1 g/dL   Albumin 4.3 3.6 - 5.1 g/dL   Calcium 9.5 8.6 - 10.3 mg/dL   GFR, Est African American >89 >=60 mL/min   GFR, Est Non African American >89 >=60 mL/min  CBC with Differential/Platelet  Result Value Ref Range   WBC 8.3 4.0 - 10.5 K/uL   RBC 5.61 4.22 - 5.81 MIL/uL   Hemoglobin 15.5 13.0 - 17.0 g/dL   HCT 44.8 39.0 - 52.0 %   MCV 79.9 78.0 - 100.0 fL   MCH 27.6 26.0 - 34.0 pg   MCHC 34.6 30.0 - 36.0 g/dL   RDW 14.4 11.5 - 15.5 %   Platelets 286 150 - 400 K/uL   MPV 9.4 8.6 - 12.4 fL   Neutrophils Relative % 57 43 - 77 %   Neutro Abs 4.7 1.7 - 7.7 K/uL   Lymphocytes Relative 35 12 - 46 %   Lymphs Abs 2.9 0.7 - 4.0 K/uL   Monocytes Relative 7 3 - 12 %   Monocytes Absolute 0.6 0.1 - 1.0 K/uL   Eosinophils Relative 1 0 - 5 %   Eosinophils Absolute 0.1 0.0 - 0.7 K/uL   Basophils Relative 0 0 - 1 %   Basophils Absolute 0.0 0.0 - 0.1 K/uL   Smear Review Criteria for review not met   Glucose, capillary  Result Value Ref Range   Glucose-Capillary 113 (H) 65 - 99 mg/dL  POCT urinalysis dipstick  Result Value Ref Range   Color, UA YELLOW    Clarity, UA CLEAR    Glucose, UA NEG    Bilirubin, UA NEG    Ketones, UA NEG    Spec Grav, UA 1.020    Blood, UA TRACE-LYSED    pH, UA 5.5    Protein, UA NEG    Urobilinogen, UA 0.2    Nitrite, UA NEG    Leukocytes, UA Negative Negative  POCT UA - Microscopic Only  Result Value Ref Range   WBC, Ur, HPF, POC NONE    RBC, urine, microscopic RARE    Bacteria, U Microscopic TRACE    Mucus, UA 1+    Epithelial cells, urine per micros RARE       Assessment & Plan:   Problem List Items Addressed This Visit    Generalized abdominal pain - Primary    Symptoms today seem more consistent with chronic generalized abdominal pain upper > lower quads, he was evaluated for back in 05/2014. Improved on diet changes and PPI trial. Never referred to GI. However also endorsing additional symptoms now for 5 days with voiding symptoms,  LBP, HA, nausea. Diff Dx: possible viral syndrome with sick contacts at home, considered UTI vs nephrolithiasis (UA negative for infection or blood, history not supportive), considered GERD, s/p choley, tolerating food and afebrile no acute abdomen, less likely acute appendicitis. Unusual history of hepatitis, but possible. Less likely pancreatitis (no prior history, no alcohol recently, no other known triggers) - CBG 113,  not consistent with DKA  Plan: 1. Check CMET, CBC today for further broad work-up - see telephone result note 2/22 all labs unremarkable, normal Cr, LFTs, WBC, reassuring. Will add on Lipase 2. Trial Omeprazole 49m x 2-4 weeks 3. Tramadol PRN pain rx given 4. May try imodium for diarrhea 5. Close follow-up within 1 week, consider future GI referral      Relevant Medications   traMADol (ULTRAM) 50 MG tablet   Other Relevant Orders   COMPLETE METABOLIC PANEL WITH GFR (Completed)   CBC with Differential/Platelet (Completed)   POCT CBG (Fasting - Glucose)   POCT UA - Microscopic Only (Completed)   Lipase   GERD (gastroesophageal reflux disease)    Not entirely consistent with GERD. No history of PUD. Off omeprazole since 10/2014 - Refilled, omeprazole 40 daily for trial to see if improves current symptoms      Relevant Medications   omeprazole (PRILOSEC) 40 MG capsule   Hypertension    Elevated BP acutely today initially electronic cuff reading likely error at 200/170, repeat manual 160/98. - Taking meds - Suspect due to acute illness / pain - Follow-up within 1 month after acute illness for BP      Increased urinary frequency   Relevant Orders   POCT urinalysis dipstick (Completed)   Low back pain    Unlikely nephrolithiasis with negative blood, UA not consistent for pyelo and afebrile - Tramadol for pain, follow-up      Relevant Medications   traMADol (ULTRAM) 50 MG tablet      Meds ordered this encounter  Medications  . traMADol (ULTRAM) 50 MG tablet      Sig: Take 1-2 tablets (50-100 mg total) by mouth every 8 (eight) hours as needed for moderate pain.    Dispense:  20 tablet    Refill:  0  . omeprazole (PRILOSEC) 40 MG capsule    Sig: Take 1 capsule (40 mg total) by mouth daily.    Dispense:  30 capsule    Refill:  2      Follow up plan: Return in about 2 days (around 04/23/2015) for abdominal pain, back pain, diarrhea, HA.  ANobie Putnam [place] PGY-3

## 2015-04-21 NOTE — Patient Instructions (Signed)
Thank you for coming in to clinic today.  1. I do not know the exact cause of your symptoms at this time - Urine looks normal - no infection or kidney stone - I do not think this is from an acute back or disc problem - It does not sound consistent with Pancreas  It could be related to Acid Reflux returning, otherwise it may be a Viral Gastroenteritis that will run its course  Try Tramadol as needed for pain May take Tylenol as needed as well Refilled Omeprazole, try if you want after a few days if still persistent this will take up to 2 weeks to take effect  We checked blood work, please return by Friday or Monday to follow-up lab results and follow-up Abdominal Pain / Back Pain and Diarrhea  If you have any other questions or concerns, please feel free to call the clinic to contact me. You may also schedule an earlier appointment if necessary.  However, if your symptoms get significantly worse, please go to the Emergency Department to seek immediate medical attention.  Nobie Putnam, [place]

## 2015-04-22 ENCOUNTER — Telehealth: Payer: Self-pay | Admitting: Family Medicine

## 2015-04-22 LAB — COMPLETE METABOLIC PANEL WITH GFR
ALK PHOS: 117 U/L — AB (ref 40–115)
ALT: 16 U/L (ref 9–46)
AST: 16 U/L (ref 10–35)
Albumin: 4.3 g/dL (ref 3.6–5.1)
BILIRUBIN TOTAL: 0.6 mg/dL (ref 0.2–1.2)
BUN: 15 mg/dL (ref 7–25)
CO2: 25 mmol/L (ref 20–31)
Calcium: 9.5 mg/dL (ref 8.6–10.3)
Chloride: 104 mmol/L (ref 98–110)
Creat: 0.93 mg/dL (ref 0.70–1.33)
GFR, Est African American: >89 mL/min (ref >=60)
GLUCOSE: 112 mg/dL — AB (ref 65–99)
POTASSIUM: 4 mmol/L (ref 3.5–5.3)
SODIUM: 138 mmol/L (ref 135–146)
Total Protein: 7.8 g/dL (ref 6.1–8.1)

## 2015-04-22 NOTE — Assessment & Plan Note (Addendum)
Elevated BP acutely today initially electronic cuff reading likely error at 200/170, repeat manual 160/98. - Taking meds - Suspect due to acute illness / pain - Follow-up within 1 month after acute illness for BP

## 2015-04-22 NOTE — Telephone Encounter (Signed)
Last seen by me on 04/21/15 for constellation of symptoms with general abdominal pain, low back pain, nausea, headache, urinary frequency. During the visit, we could not find a clear cause of his symptoms. UA was unremarkable (unlikely UTI, unlikely kidney stone), CBG normal 113. No evidence of acute abdomen. Afebrile. Checked CMET with normal results Cr 0.93, LFTs normal (16, 16, and slightly elevated alk phos 117 but stable from prior and s/p cholecystectomy), CBC showed normal WBC 8.3, less likely acute intra-abdominal infection. No other significant findings on lab results.  Called patient today to follow-up and review his lab results. Did not answer [phone] (H), left a brief voicemail stating that "all lab results were normal".  If he calls back, and needs more clarification on lab results, please relay the following: 1. Normal kidney and liver function 2. Normal electrolytes (sodium, potassium) 3. Normal white blood cells (unlikely a bacterial or serious infection)  Recommend to continue taking the pain medicine as needed, then may try the acid reflux medicine, also can try imodium for diarrhea.  Follow-up within 1 week if stable, or few days if worsening, and if significant worsening go directly to ED.  Results for orders placed or performed in visit on 04/21/15 (from the past 24 hour(s))  POCT urinalysis dipstick     Status: Abnormal   Collection Time: 04/21/15  4:37 PM  Result Value Ref Range   Color, UA YELLOW    Clarity, UA CLEAR    Glucose, UA NEG    Bilirubin, UA NEG    Ketones, UA NEG    Spec Grav, UA 1.020    Blood, UA TRACE-LYSED    pH, UA 5.5    Protein, UA NEG    Urobilinogen, UA 0.2    Nitrite, UA NEG    Leukocytes, UA Negative Negative   Narrative   Reflex to microscopic   POCT UA - Microscopic Only     Status: None   Collection Time: 04/21/15  4:37 PM  Result Value Ref Range   WBC, Ur, HPF, POC NONE    RBC, urine, microscopic RARE    Bacteria, U  Microscopic TRACE    Mucus, UA 1+    Epithelial cells, urine per micros RARE   COMPLETE METABOLIC PANEL WITH GFR     Status: Abnormal   Collection Time: 04/21/15  4:45 PM  Result Value Ref Range   Sodium 138 135 - 146 mmol/L   Potassium 4.0 3.5 - 5.3 mmol/L   Chloride 104 98 - 110 mmol/L   CO2 25 20 - 31 mmol/L   Glucose, Bld 112 (H) 65 - 99 mg/dL   BUN 15 7 - 25 mg/dL   Creat 0.93 0.70 - 1.33 mg/dL   Total Bilirubin 0.6 0.2 - 1.2 mg/dL   Alkaline Phosphatase 117 (H) 40 - 115 U/L   AST 16 10 - 35 U/L   ALT 16 9 - 46 U/L   Total Protein 7.8 6.1 - 8.1 g/dL   Albumin 4.3 3.6 - 5.1 g/dL   Calcium 9.5 8.6 - 10.3 mg/dL   GFR, Est African American >89 >=60 mL/min   GFR, Est Non African American >89 >=60 mL/min   Narrative   Performed at:  [facility] [address], [address]                [place], [place] [address]  CBC with Differential/Platelet  Status: None   Collection Time: 04/21/15  4:45 PM  Result Value Ref Range   WBC 8.3 4.0 - 10.5 K/uL   RBC 5.61 4.22 - 5.81 MIL/uL   Hemoglobin 15.5 13.0 - 17.0 g/dL   HCT 44.8 39.0 - 52.0 %   MCV 79.9 78.0 - 100.0 fL   MCH 27.6 26.0 - 34.0 pg   MCHC 34.6 30.0 - 36.0 g/dL   RDW 14.4 11.5 - 15.5 %   Platelets 286 150 - 400 K/uL   MPV 9.4 8.6 - 12.4 fL   Neutrophils Relative % 57 43 - 77 %   Neutro Abs 4.7 1.7 - 7.7 K/uL   Lymphocytes Relative 35 12 - 46 %   Lymphs Abs 2.9 0.7 - 4.0 K/uL   Monocytes Relative 7 3 - 12 %   Monocytes Absolute 0.6 0.1 - 1.0 K/uL   Eosinophils Relative 1 0 - 5 %   Eosinophils Absolute 0.1 0.0 - 0.7 K/uL   Basophils Relative 0 0 - 1 %   Basophils Absolute 0.0 0.0 - 0.1 K/uL   Smear Review Criteria for review not met    Narrative   Performed at:  [address], [address]                [place], [place] [address]  Glucose, capillary     Status: Abnormal   Collection Time: 04/21/15  4:54 PM  Result Value Ref Range   Glucose-Capillary 113 (H)  65 - 99 mg/dL

## 2015-04-22 NOTE — Assessment & Plan Note (Signed)
Unlikely nephrolithiasis with negative blood, UA not consistent for pyelo and afebrile - Tramadol for pain, follow-up

## 2015-04-22 NOTE — Assessment & Plan Note (Signed)
Not entirely consistent with GERD. No history of PUD. Off omeprazole since 10/2014 - Refilled, omeprazole 40 daily for trial to see if improves current symptoms

## 2015-04-22 NOTE — Assessment & Plan Note (Addendum)
Symptoms today seem more consistent with chronic generalized abdominal pain upper > lower quads, he was evaluated for back in 05/2014. Improved on diet changes and PPI trial. Never referred to GI. However also endorsing additional symptoms now for 5 days with voiding symptoms, LBP, HA, nausea. Diff Dx: possible viral syndrome with sick contacts at home, considered UTI vs nephrolithiasis (UA negative for infection or blood, history not supportive), considered GERD, s/p choley, tolerating food and afebrile no acute abdomen, less likely acute appendicitis. Unusual history of hepatitis, but possible. Less likely pancreatitis (no prior history, no alcohol recently, no other known triggers) - CBG 113, not consistent with DKA  Plan: 1. Check CMET, CBC today for further broad work-up - see telephone result note 2/22 all labs unremarkable, normal Cr, LFTs, WBC, reassuring. Will add on Lipase 2. Trial Omeprazole 40mg  x 2-4 weeks 3. Tramadol PRN pain rx given 4. May try imodium for diarrhea 5. Close follow-up within 1 week, consider future GI referral

## 2015-04-28 ENCOUNTER — Encounter: Payer: Self-pay | Admitting: Internal Medicine

## 2015-04-28 ENCOUNTER — Ambulatory Visit (INDEPENDENT_AMBULATORY_CARE_PROVIDER_SITE_OTHER): Payer: Commercial Managed Care - HMO | Admitting: Internal Medicine

## 2015-04-28 ENCOUNTER — Other Ambulatory Visit: Payer: Self-pay | Admitting: Family Medicine

## 2015-04-28 VITALS — BP 138/86 | HR 74 | Temp 98.0°F | Wt 225.0 lb

## 2015-04-28 DIAGNOSIS — R197 Diarrhea, unspecified: Secondary | ICD-10-CM

## 2015-04-28 DIAGNOSIS — R1084 Generalized abdominal pain: Secondary | ICD-10-CM

## 2015-04-28 DIAGNOSIS — Z23 Encounter for immunization: Secondary | ICD-10-CM

## 2015-04-28 LAB — CBC
HCT: 43.9 % (ref 39.0–52.0)
Hemoglobin: 15.1 g/dL (ref 13.0–17.0)
MCH: 27.5 pg (ref 26.0–34.0)
MCHC: 34.4 g/dL (ref 30.0–36.0)
MCV: 79.8 fL (ref 78.0–100.0)
MPV: 9.7 fL (ref 8.6–12.4)
Platelets: 288 10*3/uL (ref 150–400)
RBC: 5.5 MIL/uL (ref 4.22–5.81)
RDW: 14.2 % (ref 11.5–15.5)
WBC: 7.2 10*3/uL (ref 4.0–10.5)

## 2015-04-28 LAB — LIPASE: LIPASE: 20 U/L (ref 7–60)

## 2015-04-28 LAB — COMPLETE METABOLIC PANEL WITH GFR
ALT: 18 U/L (ref 9–46)
AST: 18 U/L (ref 10–35)
Albumin: 4.2 g/dL (ref 3.6–5.1)
Alkaline Phosphatase: 123 U/L — ABNORMAL HIGH (ref 40–115)
BUN: 11 mg/dL (ref 7–25)
CO2: 29 mmol/L (ref 20–31)
CREATININE: 0.97 mg/dL (ref 0.70–1.33)
Calcium: 9.5 mg/dL (ref 8.6–10.3)
Chloride: 101 mmol/L (ref 98–110)
GFR, EST NON AFRICAN AMERICAN: 88 mL/min (ref >=60)
GFR, Est African American: >89 mL/min (ref >=60)
GLUCOSE: 105 mg/dL — AB (ref 65–99)
Potassium: 3.8 mmol/L (ref 3.5–5.3)
SODIUM: 138 mmol/L (ref 135–146)
Total Bilirubin: 0.7 mg/dL (ref 0.2–1.2)
Total Protein: 7.6 g/dL (ref 6.1–8.1)

## 2015-04-28 NOTE — Progress Notes (Signed)
Subjective:    Shawn Schwartz - 55 y.o. male MRN [id number]  Date of birth: 02-19-1961  HPI  Shawn Schwartz is here for follow up of for nausea and stomach cramping. Was seen by PCP 1 week ago for similar complaints. At that time, UA negative for infection or blood. Patient is s/p choley. Lab work obtained and normal Cr, LFTs, and WBCs felt to be reassuring. Began trial of omeprazole at that office visit as patient has h/o of GERD. Was also given Tramadol for pain.   Today, reports that symptoms are about the same. Pain still diffusely across abdomen. Describes pain as steady and aching. Reports poor appetite. Diarrhea after eating anything, no blood in diarrhea. Feels nauseous and sometimes dry heaves but no emesis. Subjective fever at home two days ago but did not take temperature. Reports he has started trial of omeprazole but has not taken any of the Tramadol. Two children have been ill with fevers and URI symptoms about 2 weeks ago.    Health Maintenance Due  Topic Date Due  . Hepatitis C Screening  09/25/1960  . PNEUMOCOCCAL POLYSACCHARIDE VACCINE (1) 07/30/1961  . FOOT EXAM  07/30/1969  . OPHTHALMOLOGY EXAM  07/30/1969  . HIV Screening  07/31/1974  . TETANUS/TDAP  07/31/1978  . COLONOSCOPY  07/30/2009  . HEMOGLOBIN A1C  12/23/2014    -  reports that he has never smoked. He has never used smokeless tobacco. - Review of Systems: Per HPI. - Past Medical History: Patient Active Problem List   Diagnosis Date Noted  . Increased urinary frequency 04/21/2015  . Low back pain 04/21/2015  . Acute gout of left foot 10/07/2014  . Acute gout 10/07/2014  . Generalized abdominal pain 06/23/2014  . Hematoma, subungual, great toe, left 06/23/2014  . Headache(784.0) 06/12/2013  . TIA (transient ischemic attack) 05/21/2013  . Otalgia of left ear 04/11/2013  . Plantar fasciitis, right 04/03/2013  . High ankle sprain of right lower extremity 03/22/2013  . Health maintenance examination  09/27/2012  . Hyperlipidemia 09/13/2011  . Hypertension 09/13/2011  . Type 2 diabetes mellitus ([place]) 09/13/2011  . GERD (gastroesophageal reflux disease) 09/13/2011   - Medications: reviewed and updated Current Outpatient Prescriptions  Medication Sig Dispense Refill  . amLODipine (NORVASC) 5 MG tablet TAKE 1 TABLET (5 MG TOTAL) BY MOUTH DAILY. 30 tablet 3  . [facility] ASPIRIN CHILD 81 MG chewable tablet CHEW 1 TABLET (81 MG TOTAL) BY MOUTH DAILY. 30 tablet 9  . lisinopril-hydrochlorothiazide (PRINZIDE,ZESTORETIC) 10-12.5 MG per tablet TAKE 2 TABLETS BY MOUTH DAILY. 60 tablet 3  . lovastatin (MEVACOR) 20 MG tablet Take 1 tablet (20 mg total) by mouth at bedtime. (Patient not taking: Reported on 10/07/2014) 90 tablet 1  . omeprazole (PRILOSEC) 40 MG capsule Take 1 capsule (40 mg total) by mouth daily. 30 capsule 2  . traMADol (ULTRAM) 50 MG tablet Take 1-2 tablets (50-100 mg total) by mouth every 8 (eight) hours as needed for moderate pain. 20 tablet 0   No current facility-administered medications for this visit.     Review of Systems See HPI     Objective:   Physical Exam BP 138/86 mmHg  Pulse 74  Temp(Src) 98 F (36.7 C) (Oral)  Wt 225 lb (102.059 kg) Gen: alert, cooperative with exam, well-appearing Abd: +BS, soft, no distention, TTP in upper quadrants bilaterally and in RLQ, some voluntary guarding present, no rebound tenderness   Assessment & Plan:   Generalized abdominal pain Diarrhea and abdominal  pain potentially viral in nature, however given long time course (almost 2 weeks) of symptoms not improving less likely at this point. Symptoms do not seem consistent with GERD and short trial of omeprazole has not improved symptoms. Patient does not have acute abdomen or fevers so less likely to be acute appendicitis. Less likely to be pancreatitis as patient does not have chronic history of EtOH abuse or recent h/o heavy consumption. Red flags such as hematochezia and weight loss are  not present.  Concern for colitis or infectious process remains on differential. Patient has never had a colonoscopy. Other considerations in the differential include small SBO/ileus given history of diarrhea in relation to meals. -repeat CBC and CMET today  -check lipase as this did not end up getting added on at last visit as planned -ESR ordered today -would consider CT abdomen if ESR elevated or symptoms persist  -stool culture, ova and parasite, and hemoccult ordered today  -GI referral placed for further evaluation of diarrhea and patient due for screening colonoscopy      Phill Myron, D.O. 04/28/2015, 6:46 PM PGY-1, [place]

## 2015-04-28 NOTE — Patient Instructions (Signed)
Continue to take the Omeprazole. You can also try PeptoBismal for the stomach cramping. We will call you with the lab results and let you know what needs to be done next. I have also placed a referral to GI. Follow up with Dr. Neoma Laming as needed.   Take Care,   Dr. Juleen China

## 2015-04-28 NOTE — Assessment & Plan Note (Addendum)
Diarrhea and abdominal pain potentially viral in nature, however given long time course (almost 2 weeks) of symptoms not improving less likely at this point. Symptoms do not seem consistent with GERD and short trial of omeprazole has not improved symptoms. Patient does not have acute abdomen or fevers so less likely to be acute appendicitis. Less likely to be pancreatitis as patient does not have chronic history of EtOH abuse or recent h/o heavy consumption. Red flags such as hematochezia and weight loss are not present.  Concern for colitis or infectious process remains on differential. Patient has never had a colonoscopy. Other considerations in the differential include small SBO/ileus given history of diarrhea in relation to meals. -repeat CBC and CMET today  -check lipase as this did not end up getting added on at last visit as planned -ESR ordered today -would consider CT abdomen if ESR elevated or symptoms persist  -stool culture, ova and parasite, and hemoccult ordered today  -GI referral placed for further evaluation of diarrhea and patient due for screening colonoscopy

## 2015-04-29 ENCOUNTER — Telehealth: Payer: Self-pay | Admitting: Family Medicine

## 2015-04-29 ENCOUNTER — Encounter: Payer: Self-pay | Admitting: Gastroenterology

## 2015-04-29 DIAGNOSIS — R1084 Generalized abdominal pain: Secondary | ICD-10-CM

## 2015-04-29 MED ORDER — DICYCLOMINE HCL 10 MG PO CAPS: 10.0000 mg | ORAL_CAPSULE | Freq: Three times a day (TID) | ORAL | Status: DC | PRN

## 2015-04-29 NOTE — Telephone Encounter (Signed)
I last saw patient 04/21/15 for abdominal pain, n/v, diarrhea and constellation of symptoms. He followed up for same complaints on 04/28/15 without significant improvement. At that time, repeat labs CMET, CBC, and Lipase obtained, no ESR collected or available results in Epic. Results showed stable unchanged CMET with normal LFTs, still mild elevated Alk phos but improved from before 105, WBC 7.2 (from 8.3), and Lipase 20. Additionally Stool Cultures, ova & parasite tests ordered, patient to turn in samples within next few days. He was referred to GI (scheduled for [facility] 06/04/15, and then colonoscopy 06/18/15).  I called patient to discuss the FMLA paperwork I received by his request. Additionally I updated him on all the above lab results, essentially unremarkable. His symptoms remain persistent, may slightly improved with reduced pain and diarrhea if he does not eat as much, eating seems to provoke most of his symptoms, Tramadol does help his pain he takes one daily recently.  With regards to FMLA, I completed all of the forms provided with the patient's assistance for each item, forms are copied for future reference. Briefly, onset 04/15/15, time period out of work 04/17/15 through 05/03/15, try to return to work 05/04/15, Unclear diagnosis for abdominal pain, nausea/vomiting, diarrhea, initial work-up negative for acute causes, now pending stool cultures, referral GI and colonoscopy, only work restrictions listed as "avoid heavy lifting >20 lbs and bending forward to lift". Paperwork completed and available for pickup anytime after today 04/29/15 in front office [facility]. Patient will drop off stool samples when he picks up papers.  Additionally, I talked to patient again, and added a rx for Bentyl (Dicyclomine) 10-29m TID PRN abdominal cramping as a trial to see if helps symptoms.  ANobie Putnam DDonnelly PGY-3

## 2015-04-30 LAB — SEDIMENTATION RATE: Sed Rate: 4 mm/hr (ref 0–20)

## 2015-04-30 NOTE — Telephone Encounter (Signed)
Updated ESR result 4 (resulted on 04/30/15). Reassuring for less likely acute infectious or inflammatory GI problem.  Nobie Putnam, [place], PGY-3

## 2015-05-04 ENCOUNTER — Telehealth: Payer: Self-pay | Admitting: Family Medicine

## 2015-05-04 NOTE — Telephone Encounter (Signed)
Left message on voicemail that it should be ok to drop off paper work as he was recently seen. Asked that he call back with any questions.

## 2015-05-04 NOTE — Telephone Encounter (Signed)
Shawn Schwartz need to have some forms from his employer completed and signed by provivder.  Please call and inform him if he can leave documents up front to be given to provider or if he need an appt.  Nothing available until 3/20 with provider.

## 2015-05-06 ENCOUNTER — Encounter: Payer: Self-pay | Admitting: Family Medicine

## 2015-05-06 NOTE — Telephone Encounter (Signed)
See last telephone note 04/29/15 with details on completed FMLA paperwork. This should include all of the forms he needed. I have not heard about any issues with the FMLA paperwork, but will stay tuned in case anything needs to be edited or updated.  I received a hand written letter on 05/05/15 requesting a statement clearing him for work Monday 05/11/15. This letter was written in Epic, printed, signed and placed up front for pick-up today 05/06/15. I called him and left a voicemail stating he may come pick it up at anytime.  Nobie Putnam, [place], PGY-3

## 2015-05-11 ENCOUNTER — Encounter: Payer: Self-pay | Admitting: Family Medicine

## 2015-05-20 ENCOUNTER — Encounter: Payer: Self-pay | Admitting: Family Medicine

## 2015-05-20 NOTE — Telephone Encounter (Signed)
Patient requested new doctors note, updated, last visit from 04/28/15. Now note states cleared to return to work on Monday 05/25/15.  Please call patient to notify him that this new updated note is ready for pick-up from [facility] front office.  Nobie Putnam, [place], PGY-3

## 2015-06-02 ENCOUNTER — Telehealth: Payer: Self-pay | Admitting: *Deleted

## 2015-06-02 NOTE — Telephone Encounter (Signed)
He may benefit from a office visit first. Ok to schedule with AP. Thanks

## 2015-06-02 NOTE — Telephone Encounter (Signed)
Dr. Silverio Decamp,  This pt is coming in for a PV on 06-04-15 as a direct for a colonoscopy on 06-18-15.  Please note his recent hx of GI symptoms- please see his OV with his GP on 04-28-15.  Given his recent problems, would you like an OV with an extender first or is he ok for a direct colonoscopy?  Thanks, [employer]

## 2015-06-08 ENCOUNTER — Ambulatory Visit (INDEPENDENT_AMBULATORY_CARE_PROVIDER_SITE_OTHER): Payer: Commercial Managed Care - HMO | Admitting: Gastroenterology

## 2015-06-08 ENCOUNTER — Encounter: Payer: Self-pay | Admitting: Gastroenterology

## 2015-06-08 VITALS — BP 166/100 | HR 80 | Ht 67.75 in | Wt 228.0 lb

## 2015-06-08 DIAGNOSIS — R1310 Dysphagia, unspecified: Secondary | ICD-10-CM | POA: Insufficient documentation

## 2015-06-08 DIAGNOSIS — R194 Change in bowel habit: Secondary | ICD-10-CM | POA: Diagnosis not present

## 2015-06-08 DIAGNOSIS — R1084 Generalized abdominal pain: Secondary | ICD-10-CM | POA: Diagnosis not present

## 2015-06-08 MED ORDER — NA SULFATE-K SULFATE-MG SULF 17.5-3.13-1.6 GM/177ML PO SOLN: 1.0000 | Freq: Once | ORAL | Status: DC

## 2015-06-08 NOTE — Patient Instructions (Signed)
You have been scheduled for an endoscopy and colonoscopy. Please follow the written instructions given to you at your visit today. Please pick up your prep supplies at the pharmacy within the next 1-3 days. [facility] [address]  If you use inhalers (even only as needed), please bring them with you on the day of your procedure. Your physician has requested that you go to www.startemmi.com and enter the access code given to you at your visit today. This web site gives a general overview about your procedure. However, you should still follow specific instructions given to you by our office regarding your preparation for the procedure.      Normal BMI (Body Mass Index- based on height and weight) is between 19 and 25. Your BMI today is Body mass index is 34.92 kg/(m^2). Marland Kitchen Please consider follow up  regarding your BMI with your Primary Care Provider.

## 2015-06-08 NOTE — Progress Notes (Signed)
06/08/2015 Shawn Schwartz [id number] [birth year]   HISTORY OF PRESENT ILLNESS:  This is a 55 year old male who is referred to our office by his PCP, Dr. Parks Ranger, to discuss colonoscopy for evaluation of his recent abdominal complaints. He tells me that for the past couple of months he's been having diffuse lower abdominal pain and constantly feels bloated like he has just eaten a large meal. He tells me that he's had alternating bowel habits; some days he doesn't have a bowel movement, other days it is normal, and other days it is loose. He was given dicyclomine to take on an as-needed basis, which he has used on a few occasions with some success.  While he is here he also reports intermittent solid food dysphagia that has been going on intermittently for a while, but possibly worsened recently. He denies any dysphagia to liquids.  Has been placed on omeprazole 40 mg daily, which he has been taking for just a couple of weeks.  CBC, lipase, CMP, sedimentation rate are all within normal limits.  Past Medical History  Diagnosis Date  . Diabetes mellitus     "borderline, controlled with diet"  . Hyperlipidemia   . Hypertension   . GERD (gastroesophageal reflux disease)     sporadic; occasional Prilosec  . Stroke [facility]) 05/2013    tia   Past Surgical History  Procedure Laterality Date  . Hernia repair  2005  . Cholecystectomy  2010    reports that he has never smoked. He has never used smokeless tobacco. He reports that he drinks alcohol. He reports that he does not use illicit drugs. family history includes Cancer in his father; Depression in his brother; Diabetes in his brother; Early death in his brother; Heart attack in his mother; Heart disease in his mother; Hyperlipidemia in his brother; Hypertension in his brother, father, and mother. No Known Allergies    Outpatient Encounter Prescriptions as of 06/08/2015  Medication Sig  . amLODipine (NORVASC) 5 MG tablet TAKE 1 TABLET (5  MG TOTAL) BY MOUTH DAILY.  . [facility] ASPIRIN CHILD 81 MG chewable tablet CHEW 1 TABLET (81 MG TOTAL) BY MOUTH DAILY.  Marland Kitchen dicyclomine (BENTYL) 10 MG capsule Take 1-2 capsules (10-20 mg total) by mouth 3 (three) times daily with meals as needed for spasms.  Marland Kitchen lisinopril-hydrochlorothiazide (PRINZIDE,ZESTORETIC) 10-12.5 MG per tablet TAKE 2 TABLETS BY MOUTH DAILY.  Marland Kitchen lovastatin (MEVACOR) 20 MG tablet Take 1 tablet (20 mg total) by mouth at bedtime. (Patient not taking: Reported on 10/07/2014)  . omeprazole (PRILOSEC) 40 MG capsule Take 1 capsule (40 mg total) by mouth daily.  . traMADol (ULTRAM) 50 MG tablet Take 1-2 tablets (50-100 mg total) by mouth every 8 (eight) hours as needed for moderate pain.   No facility-administered encounter medications on file as of 06/08/2015.     REVIEW OF SYSTEMS  : All other systems reviewed and negative except where noted in the History of Present Illness.   PHYSICAL EXAM: Ht 5' 7.75" (1.721 m)  Wt 228 lb (103.42 kg)  BMI 34.92 kg/m2 General: Well developed white male in no acute distress Head: Normocephalic and atraumatic Eyes:  Sclerae anicteric, conjunctiva pink. Ears: Normal auditory acuity Lungs: Clear throughout to auscultation Heart: Regular rate and rhythm Abdomen: Soft, non-distended.  Normal bowel sounds.  Non-tender. Rectal:  Will be done at the time of colonoscopy. Musculoskeletal: Symmetrical with no gross deformities  Skin: No lesions on visible extremities Extremities: No edema  Neurological: Alert oriented  x 4, grossly non-focal Psychological:  Alert and cooperative. Normal mood and affect  ASSESSMENT AND PLAN: -Generalized abdominal pain, bloating, alternating bowel habits:  We'll schedule for colonoscopy as previously planned. May have some IBS. Continue dicyclomine when necessary for now. -Dysphagia to solid foods:  We'll schedule EGD with possible dilation as well. Question stricture/ring versus EoE or reflux related symptoms. Continue  omeprazole 40 mg daily for now.  *The risks, benefits, and alternatives to EGD and colonoscpoy were discussed with the patient and he consents to proceed.       CC:  Nobie Putnam *

## 2015-06-09 ENCOUNTER — Other Ambulatory Visit: Payer: Self-pay | Admitting: *Deleted

## 2015-06-09 MED ORDER — ASPIRIN 81 MG PO CHEW: CHEWABLE_TABLET | ORAL | Status: AC

## 2015-06-09 NOTE — Progress Notes (Signed)
Reviewed and agree with documentation and assessment and plan. K. Veena Aaliya Maultsby , MD   

## 2015-06-18 ENCOUNTER — Encounter: Payer: Commercial Managed Care - HMO | Admitting: Gastroenterology

## 2015-06-23 ENCOUNTER — Ambulatory Visit (AMBULATORY_SURGERY_CENTER): Payer: Commercial Managed Care - HMO | Admitting: Gastroenterology

## 2015-06-23 ENCOUNTER — Encounter: Payer: Self-pay | Admitting: Gastroenterology

## 2015-06-23 VITALS — BP 129/88 | HR 73 | Temp 98.6°F | Resp 16 | Ht 67.0 in | Wt 228.0 lb

## 2015-06-23 DIAGNOSIS — D129 Benign neoplasm of anus and anal canal: Secondary | ICD-10-CM

## 2015-06-23 DIAGNOSIS — D123 Benign neoplasm of transverse colon: Secondary | ICD-10-CM

## 2015-06-23 DIAGNOSIS — R1310 Dysphagia, unspecified: Secondary | ICD-10-CM

## 2015-06-23 DIAGNOSIS — K209 Esophagitis, unspecified: Secondary | ICD-10-CM

## 2015-06-23 DIAGNOSIS — K635 Polyp of colon: Secondary | ICD-10-CM | POA: Diagnosis not present

## 2015-06-23 DIAGNOSIS — R1084 Generalized abdominal pain: Secondary | ICD-10-CM

## 2015-06-23 DIAGNOSIS — Z1211 Encounter for screening for malignant neoplasm of colon: Secondary | ICD-10-CM

## 2015-06-23 DIAGNOSIS — D128 Benign neoplasm of rectum: Secondary | ICD-10-CM

## 2015-06-23 MED ORDER — SODIUM CHLORIDE 0.9 % IV SOLN: 500.0000 mL | INTRAVENOUS | Status: DC

## 2015-06-23 NOTE — Progress Notes (Signed)
To recovery, report to Brown, RN, VSS. 

## 2015-06-23 NOTE — Op Note (Signed)
[facility] Patient Name: Shawn Schwartz Procedure Date: 06/23/2015 12:24 PM MRN: [id number] Endoscopist: Mauri Pole , MD Age: 55 Date of Birth: May 04, 1960 Gender: Male Procedure:                Upper GI endoscopy Indications:              Dysphagia Medicines:                Monitored Anesthesia Care Procedure:                Pre-Anesthesia Assessment:                           - Prior to the procedure, a History and Physical                            was performed, and patient medications and                            allergies were reviewed. The patient's tolerance of                            previous anesthesia was also reviewed. The risks                            and benefits of the procedure and the sedation                            options and risks were discussed with the patient.                            All questions were answered, and informed consent                            was obtained. Prior Anticoagulants: The patient has                            taken no previous anticoagulant or antiplatelet                            agents. ASA Grade Assessment: II - A patient with                            mild systemic disease. After reviewing the risks                            and benefits, the patient was deemed in                            satisfactory condition to undergo the procedure.                           After obtaining informed consent, the endoscope was  passed under direct vision. Throughout the                            procedure, the patient's blood pressure, pulse, and                            oxygen saturations were monitored continuously. The                            Model GIF-HQ190 [id number]) scope was introduced                            through the mouth, and advanced to the second part                            of duodenum. The upper GI endoscopy was                            accomplished  without difficulty. The patient                            tolerated the procedure well. Scope In: Scope Out: Findings:                 Random biopsies were obtained with cold forceps for                            evaluation of eosinophilic esophagitis in the lower                            third of the esophagus, as well as random biopsies                            in the middle third of the esophagus.                           The exam was otherwise without abnormality. Complications:            No immediate complications. Estimated Blood Loss:     Estimated blood loss was minimal. Impression:               - The examination was otherwise normal.                           - Biopsy performed in the lower third of the                            esophagus and in the middle third of the esophagus. Recommendation:           - Patient has a contact number available for                            emergencies. The signs and symptoms of potential  delayed complications were discussed with the                            patient. Return to normal activities tomorrow.                            Written discharge instructions were provided to the                            patient.                           - Resume previous diet.                           - Continue present medications.                           - Await pathology results.                           - No repeat upper endoscopy.                           - Return to GI office PRN. Mauri Pole, MD 06/23/2015 2:04:34 PM This report has been signed electronically.

## 2015-06-23 NOTE — Op Note (Signed)
[name] Procedure Date: 06/23/2015 12:26 PM MRN: [id number] Endoscopist: Mauri Pole , MD Age: 55 Date of Birth: [birth year] Gender: Male Procedure:                Colonoscopy Indications:              Screening for colorectal malignant neoplasm Medicines:                Monitored Anesthesia Care Procedure:                Pre-Anesthesia Assessment:                           - Prior to the procedure, a History and Physical                            was performed, and patient medications and                            allergies were reviewed. The patient's tolerance of                            previous anesthesia was also reviewed. The risks                            and benefits of the procedure and the sedation                            options and risks were discussed with the patient.                            All questions were answered, and informed consent                            was obtained. Prior Anticoagulants: The patient has                            taken no previous anticoagulant or antiplatelet                            agents. ASA Grade Assessment: II - A patient with                            mild systemic disease. After reviewing the risks                            and benefits, the patient was deemed in                            satisfactory condition to undergo the procedure.                           - Prior to the procedure, a History and Physical  was performed, and patient medications and                            allergies were reviewed. The patient's tolerance of                            previous anesthesia was also reviewed. The risks                            and benefits of the procedure and the sedation                            options and risks were discussed with the patient.                            All questions were answered, and informed consent                was obtained. Prior Anticoagulants: The patient has                            taken no previous anticoagulant or antiplatelet                            agents. ASA Grade Assessment: II - A patient with                            mild systemic disease. After reviewing the risks                            and benefits, the patient was deemed in                            satisfactory condition to undergo the procedure.                           After obtaining informed consent, the colonoscope                            was passed under direct vision. Throughout the                            procedure, the patient's blood pressure, pulse, and                            oxygen saturations were monitored continuously. The                            Model CF-HQ190L [id number]) scope was introduced                            through the anus and advanced to the the terminal                            ileum,  with identification of the appendiceal                            orifice and IC valve. The colonoscopy was performed                            without difficulty. The patient tolerated the                            procedure well. The quality of the bowel                            preparation was good. The terminal ileum, ileocecal                            valve, appendiceal orifice, and rectum were                            photographed. Scope In: 1:42:54 PM Scope Out: 1:57:04 PM Scope Withdrawal Time: 0 hours 11 minutes 38 seconds  Total Procedure Duration: 0 hours 14 minutes 10 seconds  Findings:                 The perianal and digital rectal examinations were                            normal.                           Two sessile polyps were found in the rectum and                            transverse colon. The polyps were 3 to 4 mm in                            size. These polyps were removed with a cold biopsy                            forceps. Resection  and retrieval were complete.                           A 7 mm polyp was found in the transverse colon. The                            polyp was sessile. The polyp was removed with a                            cold snare. Resection and retrieval were complete.                            27mm polypoid nodule in terminal ileum removed with                            cold snare.  Non-bleeding internal hemorrhoids were found during                            retroflexion. The hemorrhoids were small. Prominent                            anal papilla                           The exam was otherwise without abnormality. Complications:            No immediate complications. Estimated Blood Loss:     Estimated blood loss was minimal. Impression:               - Two 3 to 4 mm polyps in the rectum and in the                            transverse colon, removed with a cold biopsy                            forceps. Resected and retrieved.                           - One 7 mm polyp in the transverse colon, removed                            with a cold snare. Resected and retrieved.                           - Non-bleeding internal hemorrhoids.                           - The examination was otherwise normal. Recommendation:           - Patient has a contact number available for                            emergencies. The signs and symptoms of potential                            delayed complications were discussed with the                            patient. Return to normal activities tomorrow.                            Written discharge instructions were provided to the                            patient.                           - Resume previous diet.                           - Continue present medications.                           -  Await pathology results.                           - Repeat colonoscopy in 3 - 5 years for                            surveillance.                            - Return to GI clinic PRN. Mauri Pole, MD 06/23/2015 2:08:16 PM This report has been signed electronically.

## 2015-06-23 NOTE — Patient Instructions (Signed)

## 2015-06-23 NOTE — Progress Notes (Signed)
Called to room to assist during endoscopic procedure.  Patient ID and intended procedure confirmed with present staff. Received instructions for my participation in the procedure from the performing physician.  

## 2015-06-24 ENCOUNTER — Telehealth: Payer: Self-pay | Admitting: *Deleted

## 2015-06-24 NOTE — Telephone Encounter (Signed)
  Follow up Call-  Call back number 06/23/2015  Post procedure Call Back phone  # [phone]  Permission to leave phone message Yes     Patient questions:  Message left to call us if necessary.

## 2015-07-04 ENCOUNTER — Encounter: Payer: Self-pay | Admitting: Gastroenterology

## 2015-07-21 ENCOUNTER — Encounter: Payer: Self-pay | Admitting: Family Medicine

## 2015-07-21 ENCOUNTER — Ambulatory Visit (INDEPENDENT_AMBULATORY_CARE_PROVIDER_SITE_OTHER): Payer: Commercial Managed Care - HMO | Admitting: Family Medicine

## 2015-07-21 VITALS — BP 145/97 | HR 92 | Temp 98.1°F | Wt 229.0 lb

## 2015-07-21 DIAGNOSIS — R1084 Generalized abdominal pain: Secondary | ICD-10-CM

## 2015-07-21 DIAGNOSIS — K219 Gastro-esophageal reflux disease without esophagitis: Secondary | ICD-10-CM

## 2015-07-21 MED ORDER — OMEPRAZOLE 40 MG PO CPDR: 40.0000 mg | DELAYED_RELEASE_CAPSULE | Freq: Every day | ORAL | Status: DC

## 2015-07-21 MED ORDER — DICYCLOMINE HCL 10 MG PO CAPS
10.0000 mg | ORAL_CAPSULE | Freq: Three times a day (TID) | ORAL | Status: AC
Start: 2015-07-21 — End: ?

## 2015-07-21 NOTE — Patient Instructions (Addendum)
Your symptoms are consistent with IBS (irritable bowel syndrome)  - Start Bentyl 10mg  4 times a day. Before each meal and at bedtime.  - Start Prilosec - Keep a food diary and symptom log to see which foods affect your stomach the most. Start with the FODMAP diet on which foods to limit / eliminate.   Diet for Irritable Bowel Syndrome When you have irritable bowel syndrome (IBS), the foods you eat and your eating habits are very important. IBS may cause various symptoms, such as abdominal pain, constipation, or diarrhea. Choosing the right foods can help ease discomfort caused by these symptoms. Work with your health care provider and dietitian to find the best eating plan to help control your symptoms. WHAT GENERAL GUIDELINES DO I NEED TO FOLLOW?  Keep a food diary. This will help you identify foods that cause symptoms. Write down:  What you eat and when.  What symptoms you have.  When symptoms occur in relation to your meals.  Avoid foods that cause symptoms. Talk with your dietitian about other ways to get the same nutrients that are in these foods.  Eat more foods that contain fiber. Take a fiber supplement if directed by your dietitian.  Eat your meals slowly, in a relaxed setting.  Aim to eat 5-6 small meals per day. Do not skip meals.  Drink enough fluids to keep your urine clear or pale yellow.  Ask your health care provider if you should take an over-the-counter probiotic during flare-ups to help restore healthy gut bacteria.  If you have cramping or diarrhea, try making your meals low in fat and high in carbohydrates. Examples of carbohydrates are pasta, rice, whole grain breads and cereals, fruits, and vegetables.  If dairy products cause your symptoms to flare up, try eating less of them. You might be able to handle yogurt better than other dairy products because it contains bacteria that help with digestion. WHAT FOODS ARE NOT RECOMMENDED? The following are some foods  and drinks that may worsen your symptoms:  Fatty foods, such as [place] fries.  Milk products, such as cheese or ice cream.  Chocolate.  Alcohol.  Products with caffeine, such as coffee.  Carbonated drinks, such as soda. The items listed above may not be a complete list of foods and beverages to avoid. Contact your dietitian for more information. WHAT FOODS ARE GOOD SOURCES OF FIBER? Your health care provider or dietitian may recommend that you eat more foods that contain fiber. Fiber can help reduce constipation and other IBS symptoms. Add foods with fiber to your diet a little at a time so that your body can get used to them. Too much fiber at once might cause gas and swelling of your abdomen. The following are some foods that are good sources of fiber:  Apples.  Peaches.  Pears.  Berries.  Figs.  Broccoli (raw).  Cabbage.  Carrots.  Raw peas.  Kidney beans.  Lima beans.  Whole grain bread.  Whole grain cereal. FOR MORE INFORMATION  International Foundation for Functional Gastrointestinal Disorders: www.iffgd.[employer] Diabetes and Digestive [employer] Kidney Diseases: NetworkAffair.co.za.aspx   This information is not intended to replace advice given to you by your health care provider. Make sure you discuss any questions you have with your health care provider.   Document Released: 05/07/2003 Document Revised: 03/07/2014 Document Reviewed: 05/17/2013 Elsevier Interactive Patient Education [employer].

## 2015-07-22 NOTE — Assessment & Plan Note (Signed)
Chronic crampy generalized abdominal pain associated with changes in stool frequency and consistency, consistent with irritable bowel syndrome.  No blood in stool, weight loss or nocturnal pain, as well as negative workup including colonoscopy, normal sedimentation rate, CBC, lipase, making inflammatory bowel disease less likely.  History of alcohol abuse and ongoing alcohol use, likely contributing.  Previously had stool culture ovocytes ordered that have not been completed.  Has been inconsistent with PPI and Bentyl.  - Long discussion today about most likely diagnosis of IBS - Provided with information and instructed to FODMAPs elimination diet.  - Instructed to use PPI and Bentyl as prescribed consistently for the next month - Recommended follow-up with PCP in one month to assess improvement - Recommended he keep a diary of food intake as well as abdominal symptoms  Greater than 50 minutes was spent with patient with 25 minutes spent on counseling

## 2015-07-22 NOTE — Progress Notes (Signed)
   Subjective:    Patient ID: Shawn Schwartz, male    DOB: 03-26-1960, 55 y.o.   MRN: [id number]  Seen for Same day visit for   CC: Abdominal pain  He reports diffuse abdominal pain that comes and goes for the past several years.  Pain is described as crampiness and muscle soreness with discrete periods of exacerbation associated with change in bowel frequency in stool characteristics.  Has intermittent diarrhea and constipation.  Denies any blood in his stool.  Denies fevers, chills, weight loss or nocturnal pain.  He has been evaluated by GI with colonoscopy revealed polyps without signs of inflammation was told to follow-up in 3 years.  He denies any abdominal surgeries other than cholecystectomy.  He does report foods can effectively exacerbate his abdominal pain, but has tried eliminating some foods without much benefit, so he returned to his regular diet.  He reports an exacerbation of his abdominal pain starting this past Friday with severe cramping and diarrhea.  Denies any vomiting or fevers.  Due to the frequent diarrhea he was unable to attend work over the weekend.  He also endorses heartburn and reflux.  He has been taking his Protonix and Bentyl infrequently on an as-needed basis, and has not seen any benefit.  Endorses occasional alcohol use.   Smoking history noted  Review of Systems   See HPI for ROS. Objective:  BP 145/97 mmHg  Pulse 92  Temp(Src) 98.1 F (36.7 C) (Oral)  Wt 229 lb (103.874 kg)  General: NAD Cardiac: RRR, normal heart sounds, no murmurs. 2+ radial and PT pulses bilaterally Respiratory: CTAB, normal effort Abdomen: soft, mild diffuse tenderness, nondistended, no hepatic or splenomegaly. Bowel sounds present Extremities: no edema or cyanosis. WWP. Skin: warm and dry, no rashes noted    Assessment & Plan:   Generalized abdominal pain Chronic crampy generalized abdominal pain associated with changes in stool frequency and consistency, consistent with  irritable bowel syndrome.  No blood in stool, weight loss or nocturnal pain, as well as negative workup including colonoscopy, normal sedimentation rate, CBC, lipase, making inflammatory bowel disease less likely.  History of alcohol abuse and ongoing alcohol use, likely contributing.  Previously had stool culture ovocytes ordered that have not been completed.  Has been inconsistent with PPI and Bentyl.  - Long discussion today about most likely diagnosis of IBS - Provided with information and instructed to FODMAPs elimination diet.  - Instructed to use PPI and Bentyl as prescribed consistently for the next month - Recommended follow-up with PCP in one month to assess improvement - Recommended he keep a diary of food intake as well as abdominal symptoms  Greater than 50 minutes was spent with patient with 25 minutes spent on counseling

## 2015-11-06 ENCOUNTER — Emergency Department (HOSPITAL_COMMUNITY): Payer: Commercial Managed Care - HMO

## 2015-11-06 ENCOUNTER — Encounter (HOSPITAL_COMMUNITY): Payer: Self-pay

## 2015-11-06 ENCOUNTER — Emergency Department (HOSPITAL_COMMUNITY)
Admission: EM | Admit: 2015-11-06 | Discharge: 2015-11-06 | Disposition: A | Discharge status: Home / Self Care | Payer: Commercial Managed Care - HMO | Attending: Emergency Medicine | Admitting: Emergency Medicine

## 2015-11-06 DIAGNOSIS — Z7982 Long term (current) use of aspirin: Secondary | ICD-10-CM | POA: Insufficient documentation

## 2015-11-06 DIAGNOSIS — Z8673 Personal history of transient ischemic attack (TIA), and cerebral infarction without residual deficits: Secondary | ICD-10-CM | POA: Insufficient documentation

## 2015-11-06 DIAGNOSIS — I10 Essential (primary) hypertension: Secondary | ICD-10-CM | POA: Diagnosis not present

## 2015-11-06 DIAGNOSIS — G510 Bell's palsy: Secondary | ICD-10-CM | POA: Diagnosis not present

## 2015-11-06 DIAGNOSIS — E119 Type 2 diabetes mellitus without complications: Secondary | ICD-10-CM | POA: Diagnosis not present

## 2015-11-06 DIAGNOSIS — R29810 Facial weakness: Secondary | ICD-10-CM | POA: Diagnosis present

## 2015-11-06 DIAGNOSIS — R791 Abnormal coagulation profile: Secondary | ICD-10-CM | POA: Diagnosis not present

## 2015-11-06 LAB — CBG MONITORING, ED: GLUCOSE-CAPILLARY: 138 mg/dL — AB (ref 65–99)

## 2015-11-06 LAB — COMPREHENSIVE METABOLIC PANEL
ALK PHOS: 117 U/L (ref 38–126)
ALT: 25 U/L (ref 17–63)
AST: 24 U/L (ref 15–41)
Albumin: 3.7 g/dL (ref 3.5–5.0)
Anion gap: 9 (ref 5–15)
BILIRUBIN TOTAL: 0.7 mg/dL (ref 0.3–1.2)
BUN: 10 mg/dL (ref 6–20)
CALCIUM: 9.4 mg/dL (ref 8.9–10.3)
CO2: 25 mmol/L (ref 22–32)
Chloride: 104 mmol/L (ref 101–111)
Creatinine, Ser: 1 mg/dL (ref 0.61–1.24)
GFR calc Af Amer: >60 mL/min (ref >60)
GFR calc non Af Amer: >60 mL/min (ref >60)
GLUCOSE: 161 mg/dL — AB (ref 65–99)
POTASSIUM: 3.3 mmol/L — AB (ref 3.5–5.1)
SODIUM: 138 mmol/L (ref 135–145)
TOTAL PROTEIN: 7.1 g/dL (ref 6.5–8.1)

## 2015-11-06 LAB — I-STAT TROPONIN, ED: Troponin i, poc: 0 ng/mL (ref 0.00–0.08)

## 2015-11-06 LAB — PROTIME-INR
INR: 1.01
Prothrombin Time: 13.3 seconds (ref 11.4–15.2)

## 2015-11-06 LAB — DIFFERENTIAL
BASOS ABS: 0 10*3/uL (ref 0.0–0.1)
Basophils Relative: 0 %
Eosinophils Absolute: 0.1 10*3/uL (ref 0.0–0.7)
Eosinophils Relative: 1 %
LYMPHS ABS: 2.5 10*3/uL (ref 0.7–4.0)
LYMPHS PCT: 34 %
Monocytes Absolute: 0.5 10*3/uL (ref 0.1–1.0)
Monocytes Relative: 6 %
NEUTROS PCT: 59 %
Neutro Abs: 4.3 10*3/uL (ref 1.7–7.7)

## 2015-11-06 LAB — CBC
HCT: 43.5 % (ref 39.0–52.0)
HEMOGLOBIN: 14.4 g/dL (ref 13.0–17.0)
MCH: 27.7 pg (ref 26.0–34.0)
MCHC: 33.1 g/dL (ref 30.0–36.0)
MCV: 83.8 fL (ref 78.0–100.0)
PLATELETS: 246 10*3/uL (ref 150–400)
RBC: 5.19 MIL/uL (ref 4.22–5.81)
RDW: 13.1 % (ref 11.5–15.5)
WBC: 7.4 10*3/uL (ref 4.0–10.5)

## 2015-11-06 LAB — ETHANOL: Alcohol, Ethyl (B): <5 mg/dL (ref <5)

## 2015-11-06 LAB — I-STAT CHEM 8, ED
BUN: 12 mg/dL (ref 6–20)
CHLORIDE: 100 mmol/L — AB (ref 101–111)
CREATININE: 1 mg/dL (ref 0.61–1.24)
Calcium, Ion: 1.17 mmol/L (ref 1.15–1.40)
Glucose, Bld: 161 mg/dL — ABNORMAL HIGH (ref 65–99)
HEMATOCRIT: 44 % (ref 39.0–52.0)
Hemoglobin: 15 g/dL (ref 13.0–17.0)
POTASSIUM: 3.4 mmol/L — AB (ref 3.5–5.1)
Sodium: 141 mmol/L (ref 135–145)
TCO2: 28 mmol/L (ref 0–100)

## 2015-11-06 LAB — APTT: APTT: 28 s (ref 24–36)

## 2015-11-06 MED ORDER — HYPROMELLOSE (GONIOSCOPIC) 2.5 % OP SOLN: 1.0000 [drp] | OPHTHALMIC | 0 refills | Status: AC

## 2015-11-06 MED ORDER — PREDNISONE 10 MG PO TABS
60.0000 mg | ORAL_TABLET | Freq: Every day | ORAL | 0 refills | Status: AC
Start: 2015-11-07 — End: 2015-11-13

## 2015-11-06 MED ORDER — ARTIFICIAL TEARS OP OINT: TOPICAL_OINTMENT | Freq: Every day | OPHTHALMIC | 0 refills | Status: AC

## 2015-11-06 MED ORDER — PREDNISONE 20 MG PO TABS
60.0000 mg | ORAL_TABLET | Freq: Once | ORAL | Status: AC
  Administered 2015-11-06: 60 mg via ORAL
  Filled 2015-11-06: qty 3

## 2015-11-06 NOTE — ED Provider Notes (Signed)
[facility] DEPT Provider Note   CSN: [id number] Arrival date & time: 11/06/15  1610   An emergency department physician performed an initial assessment on this suspected stroke patient at 1616.  History   Chief Complaint Chief Complaint  Patient presents with  . Code Stroke    HPI Shawn Schwartz is a 55 y.o. male.  The history is provided by the patient.  Weakness  Primary symptoms include focal weakness (right face).  Primary symptoms include no speech change. This is a new problem. The current episode started 3 to 5 hours ago. The problem has not changed since onset.There was right facial focality noted. There has been no fever. Pertinent negatives include no altered mental status. Associated symptoms comments: Preceding vomiting and diarrheal illness, resolved. There were no medications administered prior to arrival.    Past Medical History:  Diagnosis Date  . Diabetes mellitus    "borderline, controlled with diet"  . GERD (gastroesophageal reflux disease)    sporadic; occasional Prilosec  . Hyperlipidemia   . Hypertension   . Kidney stone   . Stroke [facility]) 2013-06-28   TIA    Patient Active Problem List   Diagnosis Date Noted  . Change in bowel habits 06/08/2015  . Dysphagia 06/08/2015  . Increased urinary frequency 04/21/2015  . Low back pain 04/21/2015  . Acute gout of left foot 10/07/2014  . Acute gout 10/07/2014  . Generalized abdominal pain 06/23/2014  . Hematoma, subungual, great toe, left 06/23/2014  . Headache(784.0) 06/12/2013  . TIA (transient ischemic attack) 05/21/2013  . Otalgia of left ear 04/11/2013  . Plantar fasciitis, right 04/03/2013  . High ankle sprain of right lower extremity 03/22/2013  . Health maintenance examination 09/27/2012  . Hyperlipidemia 09/13/2011  . Hypertension 09/13/2011  . Type 2 diabetes mellitus ([place]) 09/13/2011  . GERD (gastroesophageal reflux disease) 09/13/2011    Past Surgical History:  Procedure Laterality Date    . CHOLECYSTECTOMY  2008/06/28  . INGUINAL HERNIA REPAIR Left 06-29-03       Home Medications    Prior to Admission medications   Medication Sig Start Date End Date Taking? Authorizing Provider  amLODipine (NORVASC) 5 MG tablet TAKE 1 TABLET (5 MG TOTAL) BY MOUTH DAILY. 11/06/14  Yes Alexander Devin Going, DO  aspirin ([facility] ASPIRIN CHILD) 81 MG chewable tablet CHEW 1 TABLET (81 MG TOTAL) BY MOUTH DAILY. 06/09/15  Yes Alexander Devin Going, DO  dicyclomine (BENTYL) 10 MG capsule Take 1-2 capsules (10-20 mg total) by mouth 4 (four) times daily -  before meals and at bedtime. 07/21/15  Yes Olam Idler, MD  ibuprofen (ADVIL,MOTRIN) 200 MG tablet Take 200 mg by mouth every 6 (six) hours as needed for headache.   Yes Historical Provider, MD  lisinopril-hydrochlorothiazide (PRINZIDE,ZESTORETIC) 10-12.5 MG per tablet TAKE 2 TABLETS BY MOUTH DAILY. 11/06/14  Yes Alexander J Karamalegos, DO  omeprazole (PRILOSEC) 40 MG capsule Take 1 capsule (40 mg total) by mouth daily. 07/21/15  Yes Olam Idler, MD    Family History Family History  Problem Relation Age of Onset  . Heart disease Mother     deceased, MI 52  . Hypertension Mother   . Heart attack Mother   . Leukemia Father     leukemia; died 2001/06/28  . Hypertension Father   . Depression Brother   . Diabetes Brother   . Hyperlipidemia Brother   . Hypertension Brother   . Early death Brother     "shortly after birth"  .  Heart disease Brother     pacemaker    Social History Social History  Substance Use Topics  . Smoking status: Never Smoker  . Smokeless tobacco: Never Used  . Alcohol use Yes     Comment: "rare wine cooler or a few beers a month"     Allergies   Review of patient's allergies indicates no known allergies.   Review of Systems Review of Systems  Neurological: Positive for focal weakness (right face) and weakness. Negative for speech change.  All other systems reviewed and are negative.    Physical Exam Updated  Vital Signs BP (!) 169/115   Pulse 80   Temp 97.7 F (36.5 C) (Oral)   Resp 16   SpO2 99%   Physical Exam  Constitutional: He is oriented to person, place, and time. He appears well-developed and well-nourished. No distress.  HENT:  Head: Normocephalic and atraumatic.  Eyes: Conjunctivae are normal.  Neck: Neck supple. No tracheal deviation present.  Cardiovascular: Normal rate and regular rhythm.   Pulmonary/Chest: Effort normal. No respiratory distress.  Abdominal: Soft. He exhibits no distension.  Neurological: He is alert and oriented to person, place, and time. A cranial nerve deficit (right CN7) is present. No sensory deficit. GCS eye subscore is 4. GCS verbal subscore is 5. GCS motor subscore is 6.  Loss of forehead wrinkles, eye closure on right is poor, right mouth corner weakness  Skin: Skin is warm and dry.  Psychiatric: He has a normal mood and affect.     ED Treatments / Results  Labs (all labs ordered are listed, but only abnormal results are displayed) Labs Reviewed  COMPREHENSIVE METABOLIC PANEL - Abnormal; Notable for the following:       Result Value   Potassium 3.3 (*)    Glucose, Bld 161 (*)    All other components within normal limits  I-STAT CHEM 8, ED - Abnormal; Notable for the following:    Potassium 3.4 (*)    Chloride 100 (*)    Glucose, Bld 161 (*)    All other components within normal limits  CBG MONITORING, ED - Abnormal; Notable for the following:    Glucose-Capillary 138 (*)    All other components within normal limits  ETHANOL  PROTIME-INR  APTT  CBC  DIFFERENTIAL  I-STAT TROPOININ, ED    EKG  EKG Interpretation  Date/Time:  Friday November 06 2015 16:26:31 EDT Ventricular Rate:  72 PR Interval:    QRS Duration: 113 QT Interval:  391 QTC Calculation: 428 R Axis:   62 Text Interpretation:  Sinus rhythm Borderline intraventricular conduction delay Since last tracing rate slower Otherwise no significant change Confirmed by  Nessa Ramaker MD, Quillian Quince [id number]) on 11/06/2015 4:38:08 PM       Radiology Mr Brain Wo Contrast  Result Date: 11/06/2015 CLINICAL DATA:  RIGHT eye burning and RIGHT facial paralysis this afternoon, assess for stroke. History of hypertension, hyperlipidemia and diabetes. EXAM: MRI HEAD WITHOUT CONTRAST TECHNIQUE: Multiplanar, multiecho pulse sequences of the brain and surrounding structures were obtained without intravenous contrast. COMPARISON:  CT HEAD November 06, 2015 at 1613 hours and MRI head May 22, 2013 FINDINGS: INTRACRANIAL CONTENTS: No reduced diffusion to suggest acute ischemia. No susceptibility artifact to suggest hemorrhage. The ventricles and sulci are normal for patient's age. No suspicious parenchymal signal, masses or mass effect. No abnormal extra-axial fluid collections. No extra-axial masses though, contrast enhanced sequences would be more sensitive. Normal major intracranial vascular flow voids present at skull base.  ORBITS: The included ocular globes and orbital contents are non-suspicious. SINUSES: The mastoid air-cells and included paranasal sinuses are well-aerated. SKULL/SOFT TISSUES: No abnormal sellar expansion. No suspicious calvarial bone marrow signal. Craniocervical junction maintained. IMPRESSION: Negative MRI head. Electronically Signed   By: Elon Alas M.D.   On: 11/06/2015 18:51   Ct Head Code Stroke W/o Cm  Result Date: 11/06/2015 CLINICAL DATA:  Code stroke. Code stroke. Right-sided facial paralysis. Last seen normal 2:30 p.m. EXAM: CT HEAD WITHOUT CONTRAST TECHNIQUE: Contiguous axial images were obtained from the base of the skull through the vertex without intravenous contrast. COMPARISON:  None. FINDINGS: No acute infarct, hemorrhage, or mass lesion is present. The ventricles are of normal size. No significant extraaxial fluid collection is present. The patient is somewhat oblique in the scan air. No significant white matter disease is present. Minimal  atherosclerotic changes are present within the cavernous internal carotid arteries. There is no hyperdense vessel. The brainstem and cerebellum are normal. The globes and orbits are intact. The paranasal sinuses and mastoid air cells are clear. ASPECTS [facility] Stroke Program Early CT Score) - Ganglionic level infarction (caudate, lentiform nuclei, internal capsule, insula, M1-M3 cortex): 7/7 - Supraganglionic infarction (M4-M6 cortex): 3/3 Total score (0-10 with 10 being normal): 10/10 IMPRESSION: 1. Normal CT of the head. 2. ASPECTS is 10/10 These results were called by telephone at the time of interpretation on 11/06/2015 at 4:32 pm to Dr. Leonel Ramsay , who verbally acknowledged these results. Electronically Signed   By: San Morelle M.D.   On: 11/06/2015 16:34    Procedures Procedures (including critical care time)  Medications Ordered in ED Medications  predniSONE (DELTASONE) tablet 60 mg (60 mg Oral Given 11/06/15 2028)     Initial Impression / Assessment and Plan / ED Course  I have reviewed the triage vital signs and the nursing notes.  Pertinent labs & imaging results that were available during my care of the patient were reviewed by me and considered in my medical decision making (see chart for details).  Clinical Course   55 y.o. male presents with right sided facial droop and dense CN7 motor deficit. Seen by neurology on arrival as code stroke. Agree with assessment this is bells palsy clinically. MR negative for evidence of stroke. Plan for high dose steroid burst and protective eye therapy for prevention of corneal abrasion. Plan to follow up with PCP as needed and return precautions discussed for worsening or new concerning symptoms.   Final Clinical Impressions(s) / ED Diagnoses   Final diagnoses:  Right-sided Bell's palsy    New Prescriptions New Prescriptions   No medications on file     Leo Grosser, MD 11/07/15 [phone]

## 2015-11-06 NOTE — Consult Note (Signed)
Neurology Consultation Reason for Consult: Code stroke Referring Physician: Laneta Simmers, D  CC: Facial droop  History is obtained from: Patient  HPI: Shawn Schwartz is a 55 y.o. male woken from a nap around 2:30 PM and noticed that his right eye was burning. He laid down around noon. He then noticed that his right side of his face was not moving as well as the left side and therefore has sought further care in the emergency department. Due to the rapid onset, a code stroke was called.   LKW: Noon tpa given?: no, mild symptoms    ROS: A 14 point ROS was performed and is negative except as noted in the HPI.   Past Medical History:  Diagnosis Date  . Diabetes mellitus    "borderline, controlled with diet"  . GERD (gastroesophageal reflux disease)    sporadic; occasional Prilosec  . Hyperlipidemia   . Hypertension   . Kidney stone   . Stroke [facility]) June 22, 2013   TIA     Family History  Problem Relation Age of Onset  . Heart disease Mother     deceased, MI 63  . Hypertension Mother   . Heart attack Mother   . Leukemia Father     leukemia; died 2001-06-22  . Hypertension Father   . Depression Brother   . Diabetes Brother   . Hyperlipidemia Brother   . Hypertension Brother   . Early death Brother     "shortly after birth"  . Heart disease Brother     pacemaker     Social History:  reports that he has never smoked. He has never used smokeless tobacco. He reports that he drinks alcohol. He reports that he does not use drugs.   Exam: Current vital signs: BP (!) 161/104   Pulse (!) 57   Temp 97.7 F (36.5 C) (Oral)   Resp 11   SpO2 97%  Vital signs in last 24 hours: Temp:  [97.7 F (36.5 C)] 97.7 F (36.5 C) (09/08 1627) Pulse Rate:  [57-75] 57 (09/08 1715) Resp:  [11-22] 11 (09/08 1715) BP: (150-177)/(94-104) 161/104 (09/08 1715) SpO2:  [95 %-97 %] 97 % (09/08 1715)   Physical Exam  Constitutional: Appears well-developed and well-nourished.  Psych: Affect appropriate to  situation Eyes: No scleral injection HENT: No OP obstrucion Head: Normocephalic.  Cardiovascular: Normal rate and regular rhythm.  Respiratory: Effort normal and breath sounds normal to anterior ascultation GI: Soft.  No distension. There is no tenderness.  Skin: WDI  Neuro: Mental Status: Patient is awake, alert, oriented to person, place, month, year, and situation. Patient is able to give a clear and coherent history. No signs of aphasia or neglect Cranial Nerves: II: Visual Fields are full. Pupils are equal, round, and reactive to light.   III,IV, VI: EOMI without ptosis or diploplia.  V: Facial sensation is symmetric to pinprick, though he states that touch feels slightly different with less on the left VII: He has upper and lower facial weakness on the right VIII: hearing is intact to voice X: Uvula elevates symmetrically XI: Shoulder shrug is symmetric. XII: tongue is midline without atrophy or fasciculations.  Motor: Tone is normal. Bulk is normal. 5/5 strength was present in all four extremities.  Sensory: Sensation is symmetric to light touch and temperature in the arms and legs. Cerebellar: FNF and HKS are intact bilaterally  I have reviewed labs in epic and the results pertinent to this consultation are: Elevated glucose  I have reviewed the  images obtained: CT head-negative  Impression: 55 year old male with likely Bell's palsy. He does describe some paresthesias in the right face, and I suspect this is simply due to the face not working as it normally does since pinprick is symmetric, but given the symptoms other than motor I would recommend further imaging with MRI.  If this is negative, then I would treat with steroids for Bell's palsy.  Recommendations: 1) MRI brain 2) if negative, would start prednisone 80 mg daily for 7 days, also would do belly cycle of her 1 g 3 times a day for 7 days   Roland Rack, MD [facility] [phone]  If 7pm- 7am, please page neurology on call as listed in [place].

## 2015-11-06 NOTE — ED Triage Notes (Signed)
Pt brought in by EMS due to racial facial paralysis and numbness. Pt hypertensive on presentation. Pt c/o headache x2 days. Pt a&ox4.

## 2015-11-06 NOTE — Code Documentation (Signed)
Patient states he hit his head a couple of days ago and has had a HA since.  Today about 1430 his right face suddenly became flaccid.  EMS called code stroke en route at 1602  Patient arrived at 1610  Stat labs and head CT done.  Dr Leonel Ramsay at bedside to assess patient.  NIHSS 5  Mild sensory loss of right face and mild dysarthria,  Unable to smile or raise eyebrow on right.  Per Dr Leonel Ramsay plan MRI.

## 2015-11-06 NOTE — ED Notes (Signed)
CBG 138

## 2015-11-10 ENCOUNTER — Ambulatory Visit (INDEPENDENT_AMBULATORY_CARE_PROVIDER_SITE_OTHER): Payer: Commercial Managed Care - HMO | Admitting: Internal Medicine

## 2015-11-10 VITALS — BP 162/105 | HR 64 | Temp 97.9°F | Wt 229.2 lb

## 2015-11-10 DIAGNOSIS — I10 Essential (primary) hypertension: Secondary | ICD-10-CM

## 2015-11-10 DIAGNOSIS — K219 Gastro-esophageal reflux disease without esophagitis: Secondary | ICD-10-CM | POA: Diagnosis not present

## 2015-11-10 DIAGNOSIS — G510 Bell's palsy: Secondary | ICD-10-CM

## 2015-11-10 MED ORDER — OMEPRAZOLE 20 MG PO CPDR: 20.0000 mg | DELAYED_RELEASE_CAPSULE | Freq: Every day | ORAL | 0 refills | Status: AC

## 2015-11-10 MED ORDER — LISINOPRIL-HYDROCHLOROTHIAZIDE 10-12.5 MG PO TABS: 2.0000 | ORAL_TABLET | Freq: Every day | ORAL | 3 refills | Status: AC

## 2015-11-10 MED ORDER — AMLODIPINE BESYLATE 5 MG PO TABS: ORAL_TABLET | ORAL | 3 refills | Status: AC

## 2015-11-10 NOTE — Progress Notes (Signed)
[facility] Progress Note  Subjective:  Shawn Schwartz is a 55-y/o male who presents to [facility] for follow-up of Bell's Palsy and HTN.   Bell's Palsy: - Presented to ED 11/06/15 for right facial weakness - CT head and MRI brain negative (MRI performed because of reported paresthesia of R face).  - Prescribed 7-day course of 60 mg prednisone - Still unable to close R eye and continues to use isopto tears and lacrilube ointment and to cover R eye with bandage to keep from getting scratched - Works as a Animal nutritionist at [employer] and has felt unsteady going up the stairs for his job and does not feel safe to continue working until facial weakness/eye's inability to close improves - Has not had any trouble taking prednisone - Has some trouble eating--needed to chew on L side - Has not noticed much improvement in symptoms since starting steroids ROS: occasional headaches, no muscle spasms  HTN: - Believes he is only taking 1 pill a day for HTN. Did not bring medications with him today to appointment. - Wife concerned that pt's BP has been running high because his face has turned red on occasion - Medications in chart include amlodipine 5 mg daily and lisinopril-HCTZ 10-12.5 mg two pills daily - Has not checked BP at home or at pharmacy ROS: No chest pain  Other ROS: reports months of having frequent loose stools  Social: Never smoker  No Known Allergies  Objective: Blood pressure (!) 162/105, pulse 64, temperature 97.9 F (36.6 C), temperature source Oral, weight 229 lb 3.2 oz (104 kg), SpO2 98 %. Constitutional: Overweight male, in NAD Cardiovascular: RRR, S1, S2, no m/r/g.  Abdominal: Soft. +BS, NT, ND, no rebound or guarding.  Neurological: AOx3, loss of forehead wrinkles, R eye can be closed only about 50% of the way, R mouth droop. Asymmetric tongue movement (does not move equally with side-to-side movement; sits more on L side of mouth); says sensation to  light touch across face is somewhat greater over left than right side of face Skin: Skin is warm and dry. No rash noted. No erythema.  Psychiatric: Normal mood and affect.  Vitals reviewed  Assessment/Plan: Bell's palsy - Stable. Recommended completing steroid course and following up in another 2 weeks to assess for improvement. - Provided work note, asking for time off, as patient may improve more quickly with rest (has erratic schedule with many night shifts) - If no improvement, could consider further imaging  Hypertension - Not at goal of <140/90 per JNC-8 guidelines - Unclear if patient has been taking previously prescribed anti-hypertensives; could not say what he was taking - Refilled previous medications of amlodipine 5 mg and lisinopril-HCTZ 10-12.5 two pills daily - Asked patient to return for BP check within 2 weeks and to bring his medications with him  Follow-up within 2 weeks for BP check up.  Olene Floss, MD [place], PGY-2

## 2015-11-10 NOTE — Patient Instructions (Signed)
Mr. Shawn Schwartz,  Thank you for coming in today.  I refilled your blood pressure medications today. Please take lisinopril-hydrochlorothiazide 2 tablets once daily and amlodipine 5 mg daily.  Please return for blood pressure check within 2 weeks. Please bring your medications at that time. It would also be good to follow-up on your Bell's Palsy symptoms at that time.  I have re-prescribed prilosec for reflux.  Best, Dr. Ola Spurr

## 2015-11-12 ENCOUNTER — Encounter: Payer: Self-pay | Admitting: Internal Medicine

## 2015-11-12 DIAGNOSIS — G510 Bell's palsy: Secondary | ICD-10-CM | POA: Insufficient documentation

## 2015-11-12 NOTE — Assessment & Plan Note (Addendum)
-   Stable. Recommended completing steroid course and following up in another 2 weeks to assess for improvement. - Provided work note, asking for time off, as patient may improve more quickly with rest (has erratic schedule with many night shifts) - If no improvement, could consider further imaging

## 2015-11-12 NOTE — Assessment & Plan Note (Addendum)
-   Not at goal of <140/90 per JNC-8 guidelines - Unclear if patient has been taking previously prescribed anti-hypertensives; could not say what he was taking - Refilled previous medications of amlodipine 5 mg and lisinopril-HCTZ 10-12.5 two pills daily - Asked patient to return for BP check within 2 weeks and to bring his medications with him

## 2015-12-15 ENCOUNTER — Encounter: Payer: Self-pay | Admitting: Family Medicine

## 2015-12-15 ENCOUNTER — Ambulatory Visit (INDEPENDENT_AMBULATORY_CARE_PROVIDER_SITE_OTHER): Payer: Commercial Managed Care - HMO | Admitting: Family Medicine

## 2015-12-15 DIAGNOSIS — G510 Bell's palsy: Secondary | ICD-10-CM | POA: Diagnosis not present

## 2015-12-15 NOTE — Assessment & Plan Note (Signed)
Improving. Right sided with onset during bells palsy episode. Neg head CT and MRI in ED on 11/06/15. Pain seems to be controlled with ibuprofen and is related to light exposure to right eye. Should resolve with resolution of bells palsy. - Continue ibuprofen PRN for pain - Avoid light exposure to right eye for now

## 2015-12-15 NOTE — Patient Instructions (Signed)
It was a pleasure to meet you today. Please see below to review our plan for today's visit.  1. Your eye looks much improved. Continue keeping the eye covered with the patch. Use eye drops to help lubricate eye. 2. Follow up in 1 month.  Please call the clinic at [phone] if your symptoms worsen or you have any concerns. It was my pleasure to see you. -- Harriet Butte, [place], PGY-1  Bell Palsy Bell palsy is a condition in which the muscles on one side of the face become paralyzed. This often causes one side of the face to droop. It is a common condition and most people recover completely. RISK FACTORS Risk factors for Bell palsy include:  Pregnancy.  Diabetes.  An infection by a virus, such as infections that cause cold sores. CAUSES  Bell palsy is caused by damage to or inflammation of a nerve in your face. It is unclear why this happens, but an infection by a virus may lead to it. Most of the time the reason it happens is unknown. SIGNS AND SYMPTOMS  Symptoms can range from mild to severe and can take place over a number of hours. Symptoms may include:  Being unable to:  Raise one or both eyebrows.  Close one or both eyes.  Feel parts of your face (facial numbness).  Drooping of the eyelid and corner of the mouth.  Weakness in the face.  Paralysis of half your face.  Loss of taste.  Sensitivity to loud noises.  Difficulty chewing.  Tearing up of the affected eye.  Dryness in the affected eye.  Drooling.  Pain behind one ear. DIAGNOSIS  Diagnosis of Bell palsy may include:  A medical history and physical exam.  An MRI.  A CT scan.  Electromyography (EMG). This is a test that checks how your nerves are working. TREATMENT  Treatment may include antiviral medicine to help shorten the length of the condition. Sometimes treatment is not needed and the symptoms go away on their own. HOME CARE INSTRUCTIONS   Take medicines  only as directed by your health care provider.  Do facial massages and exercises as directed by your health care provider.  If your eye is affected:  Use moisturizing eye drops to prevent drying of your eye as directed by your health care provider.  Protect your eye as directed by your health care provider. SEEK MEDICAL CARE IF:  Your symptoms do not get better or get worse.  You are drooling.  Your eye is red, irritated, or hurts. SEEK IMMEDIATE MEDICAL CARE IF:   Another part of your body feels weak or numb.  You have difficulty swallowing.  You have a fever along with symptoms of Bell palsy.  You develop neck pain. MAKE SURE YOU:   Understand these instructions.  Will watch your condition.  Will get help right away if you are not doing well or get worse.   This information is not intended to replace advice given to you by your health care provider. Make sure you discuss any questions you have with your health care provider.   Document Released: 02/14/2005 Document Revised: 11/05/2014 Document Reviewed: 05/24/2013 Elsevier Interactive Patient Education [employer].

## 2015-12-15 NOTE — Assessment & Plan Note (Addendum)
Improving. Likely idiopathic vs herpatic given lack of HSV findings or symptoms. Completed prednisone 60 mg 7-day course. Symptoms improving though some asymmetry and photophobia persists.  Patient now able to shut eyelid. No loss of vision. - Continue isopto tears for lubrication of right eye - Dry dressings over right eye for protection - Avoid bright light or rubbing of eye - Ibuprofen for pain relief PRN - Return in 1 month for f/u

## 2015-12-15 NOTE — Progress Notes (Signed)
Subjective:   Patient ID: Shawn Schwartz    DOB: [birth year], 55 y.o. male   MRN: [id number]  CC: Bells Palsy  HPI: Shawn Schwartz is a 55 y.o. male who presents to clinic today for follow up concerning episode of right-sided bells palsy. Problems discussed today are as follows:  1. Bells Palsy: onset 11/06/15, was seen in ED. Says onset was sudden and patient was given prednisone burst for 7 days. Speech has improved since being seen at [facility] on 11/10/15. Right eye still irritated but patient keeps it covered with bandage and uses isopto tears to lubricate with relief. Feels his right eye droop is improving and the pain has improved. Now able to shut eye completely though it will open some after a few minutes.  2. Headache: right sided around orbit worse with photophobia. Originated after onset of bells palsy. Patient says pain is controlled with ibuprofen use. Denies fevers, chills, neck stiffness, thunder-clap headache, nausea, or muscle spasms.  ROS: See HPI for pertinent ROS.  [place]: Pertinent past medical, surgical, family, and social history were reviewed and updated as appropriate. Smoking status reviewed.  Medications reviewed. Current Outpatient Prescriptions  Medication Sig Dispense Refill  . amLODipine (NORVASC) 5 MG tablet TAKE 1 TABLET (5 MG TOTAL) BY MOUTH DAILY. 30 tablet 3  . aspirin ([employer] ASPIRIN CHILD) 81 MG chewable tablet CHEW 1 TABLET (81 MG TOTAL) BY MOUTH DAILY. 30 tablet 9  . dicyclomine (BENTYL) 10 MG capsule Take 1-2 capsules (10-20 mg total) by mouth 4 (four) times daily -  before meals and at bedtime. 120 capsule 1  . hydroxypropyl methylcellulose / hypromellose (ISOPTO TEARS / GONIOVISC) 2.5 % ophthalmic solution Place 1 drop into the right eye every hour while awake. 15 mL 0  . ibuprofen (ADVIL,MOTRIN) 200 MG tablet Take 200 mg by mouth every 6 (six) hours as needed for headache.    . lisinopril-hydrochlorothiazide (PRINZIDE,ZESTORETIC) 10-12.5 MG tablet Take 2 tablets  by mouth daily. 60 tablet 3  . omeprazole (PRILOSEC) 20 MG capsule Take 1 capsule (20 mg total) by mouth daily. 30 capsule 0   No current facility-administered medications for this visit.     Objective:   BP (!) 157/104   Pulse (!) 101   Temp 97.8 F (36.6 C) (Oral)   Wt 231 lb (104.8 kg)   BMI 36.18 kg/m  Vitals and nursing note reviewed.  General: well nourished, well developed, in no acute distress with non-toxic appearance HEENT: normocephalic, atraumatic, moist mucous membranes, PERRLA, EOMI, white sclera, pink conjunctiva bilaterally, no purulent discharge, photophobia appreciated on right eye Neck: supple, non-tender without lymphadenopathy CV: regular rate and rhythm without murmurs, rubs, or gallops Lungs: clear to auscultation bilaterally with normal work of breathing Abdomen: soft, non-tender, non-distended, no masses or organomegaly palpable, normoactive bowel sounds Skin: warm, dry, no rashes or lesions, cap refill < 2 seconds Extremities: warm and well perfused, normal tone Neuro: right eye ptosis and facial asymmetry with lose of nasolabial fold on right, no slurring of speech        Assessment & Plan:   Bell's palsy Improving. Likely idiopathic vs herpatic given lack of HSV findings or symptoms. Completed prednisone 60 mg 7-day course. Symptoms improving though some asymmetry and photophobia persists.  Patient now able to shut eyelid. No loss of vision. - Continue isopto tears for lubrication of right eye - Dry dressings over right eye for protection - Avoid bright light or rubbing of eye - Ibuprofen for pain  relief PRN - Return in 1 month for f/u   Headache(784.0) Improving. Right sided with onset during bells palsy episode. Neg head CT and MRI in ED on 11/06/15. Pain seems to be controlled with ibuprofen and is related to light exposure to right eye. Should resolve with resolution of bells palsy. - Continue ibuprofen PRN for pain - Avoid light exposure to  right eye for now  No orders of the defined types were placed in this encounter.  No orders of the defined types were placed in this encounter.   Harriet Butte, [place], PGY-1 12/15/2015 8:06 PM

## 2015-12-28 ENCOUNTER — Telehealth: Payer: Self-pay | Admitting: Family Medicine

## 2015-12-28 NOTE — Telephone Encounter (Signed)
Patient brought letter to office for Dr Yisroel Ramming. This has been placed in the Red team folder

## 2015-12-29 ENCOUNTER — Encounter: Payer: Self-pay | Admitting: Family Medicine

## 2015-12-29 NOTE — Telephone Encounter (Signed)
Letter placed in PCP box.

## 2016-01-18 ENCOUNTER — Telehealth: Payer: Self-pay | Admitting: Family Medicine

## 2016-01-18 NOTE — Telephone Encounter (Signed)
Note left by patient placed in PCP box.

## 2016-01-18 NOTE — Telephone Encounter (Signed)
Patient asks PCP a works note until December 5th. Please, follow up.

## 2016-01-19 ENCOUNTER — Encounter: Payer: Self-pay | Admitting: Family Medicine

## 2016-01-19 NOTE — Telephone Encounter (Signed)
Work note written till December 5th. Please call pt to let him know he can pick it up front. If he needs more time off, he will need to be seen again concerning his eye. -- Harriet Butte, [place], PGY-1

## 2016-01-19 NOTE — Telephone Encounter (Signed)
LMOVM for pt to call us back. Deseree Blount, CMA  

## 2017-01-02 ENCOUNTER — Ambulatory Visit
Admission: RE | Admit: 2017-01-02 | Discharge: 2017-01-02 | Disposition: A | Discharge status: Home / Self Care | Payer: 59 | Source: Ambulatory Visit | Attending: Podiatry | Admitting: Podiatry

## 2017-01-02 ENCOUNTER — Other Ambulatory Visit: Payer: Self-pay | Admitting: Podiatry

## 2017-01-02 DIAGNOSIS — M7660 Achilles tendinitis, unspecified leg: Secondary | ICD-10-CM

## 2018-07-06 ENCOUNTER — Encounter: Payer: Self-pay | Admitting: Gastroenterology

## 2019-03-13 IMAGING — MR MR ANKLE*R* W/O CM
3 of 5 series · 8 of 40 positions shown · non-contrast
Comparison: Radiographs from 03/22/2013

CLINICAL DATA: Swelling and pain after walking on December 27, 2016.

EXAM:
MRI OF THE RIGHT ANKLE WITHOUT CONTRAST
TECHNIQUE: Multiplanar, multisequence MR imaging of the ankle was performed. No
intravenous contrast was administered.

[Series 4: PD fat-sat · axial · left · 4.0mm · 0.20mm/px · z∈[+3,+90]mm · 3 of 27 slices shown]
[im 4/27]
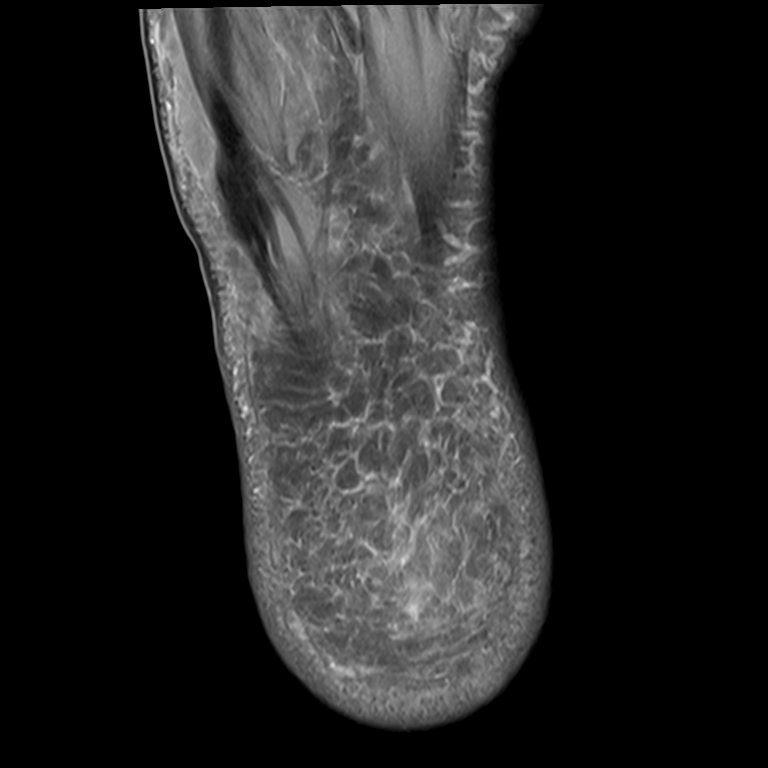
[im 15/27]
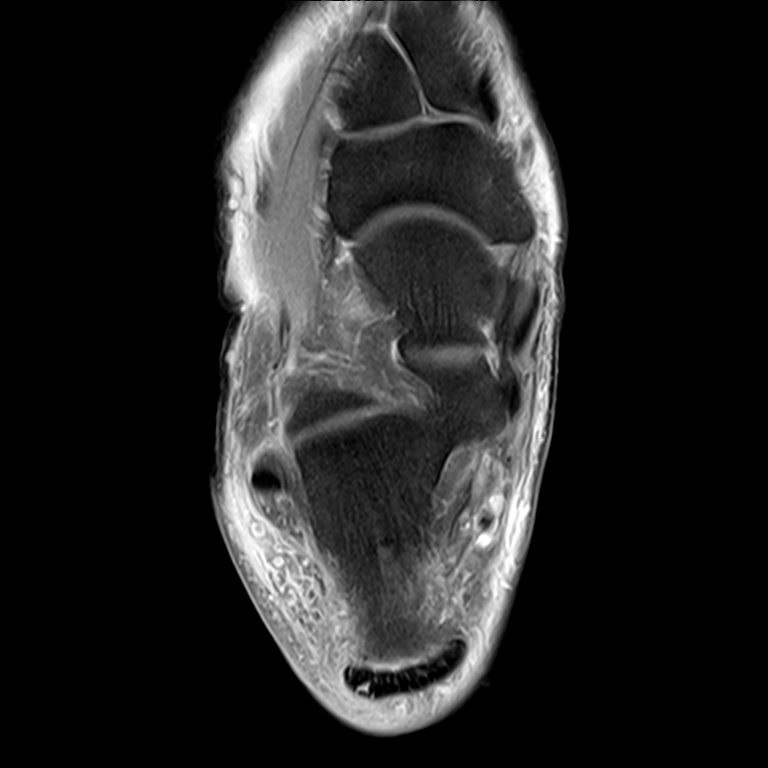
[im 23/27]
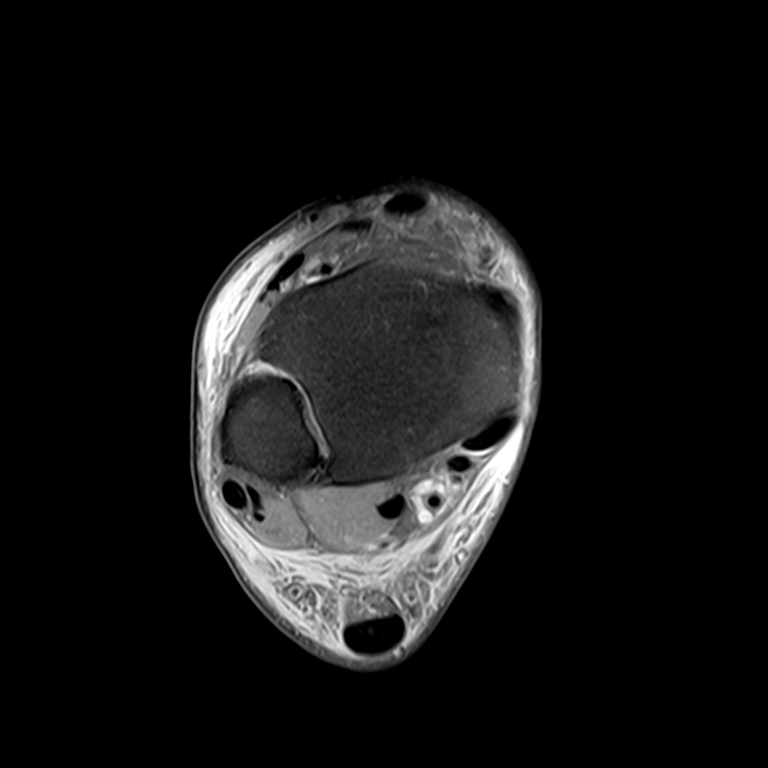

[Series 5: T2 fat-sat · axial · left · 4.0mm · 0.20mm/px · z∈[+7,+86]mm · 3 of 27 slices shown (1 of 2)]
[im 5/27]
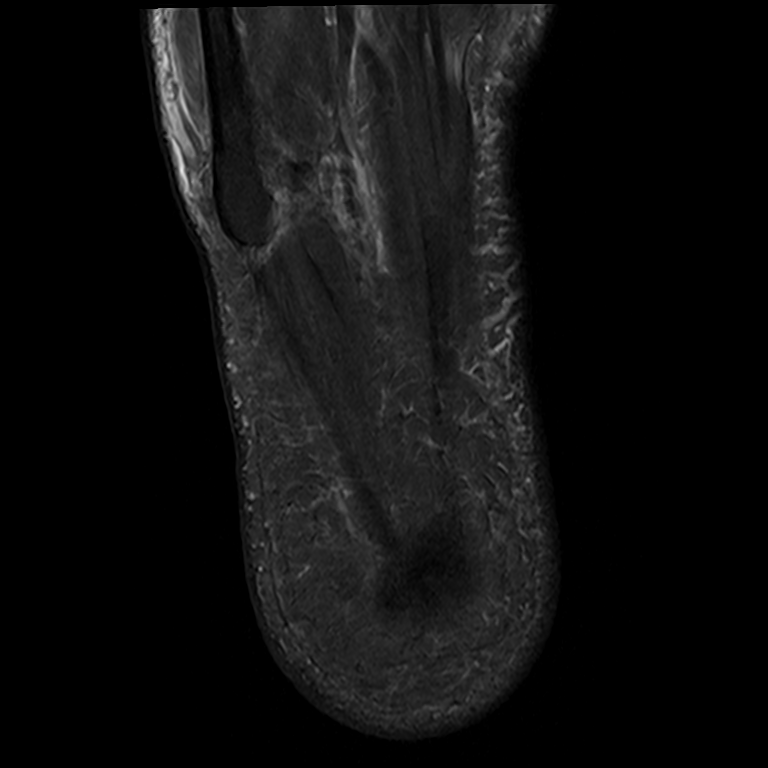
[im 14/27]
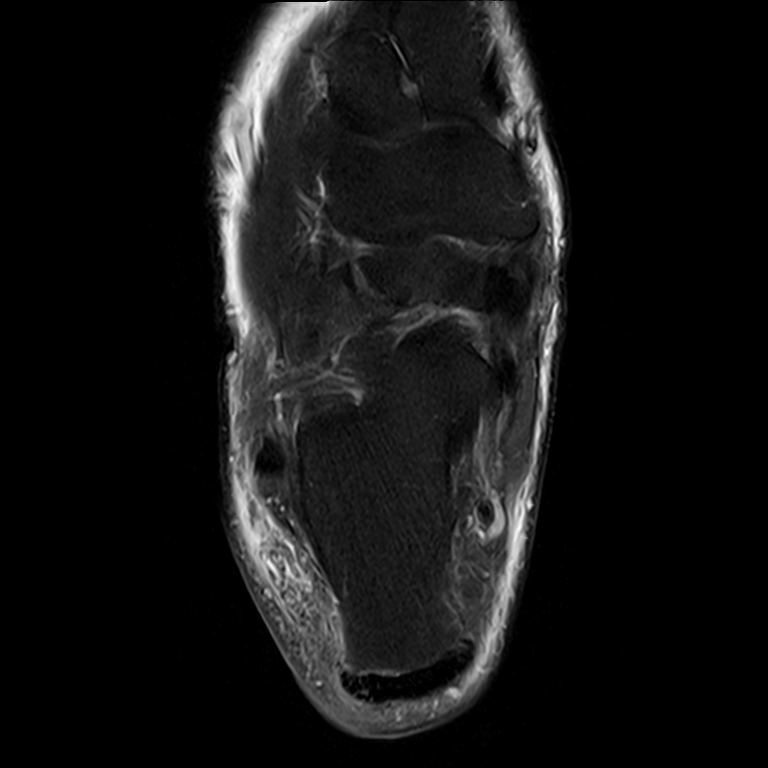
[im 22/27]
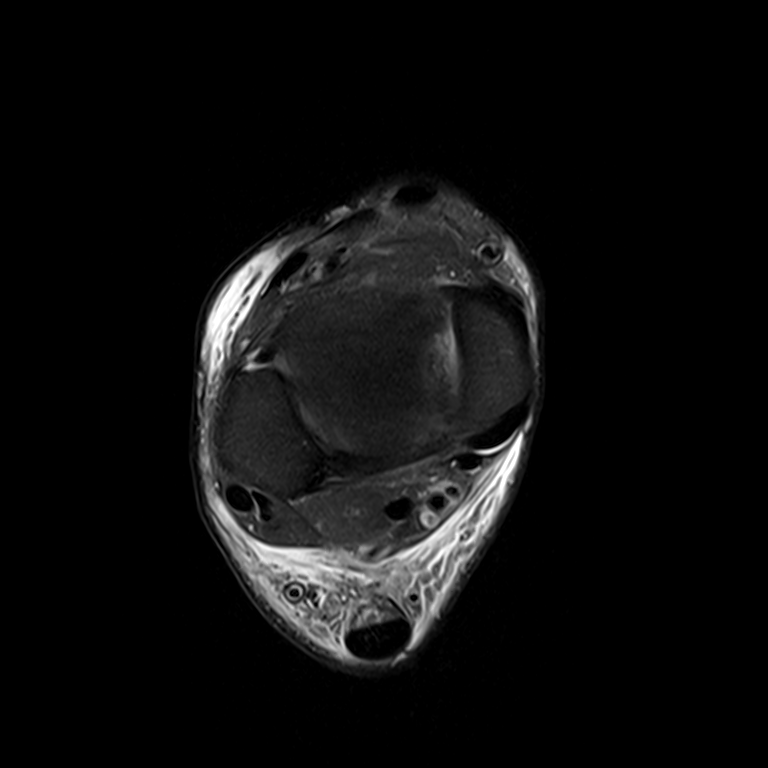

[Series 6: T2 fat-sat · sagittal · left · 2.5mm · 0.21mm/px · 2 of 32 slices shown (2 of 2)]
[im 5/32]
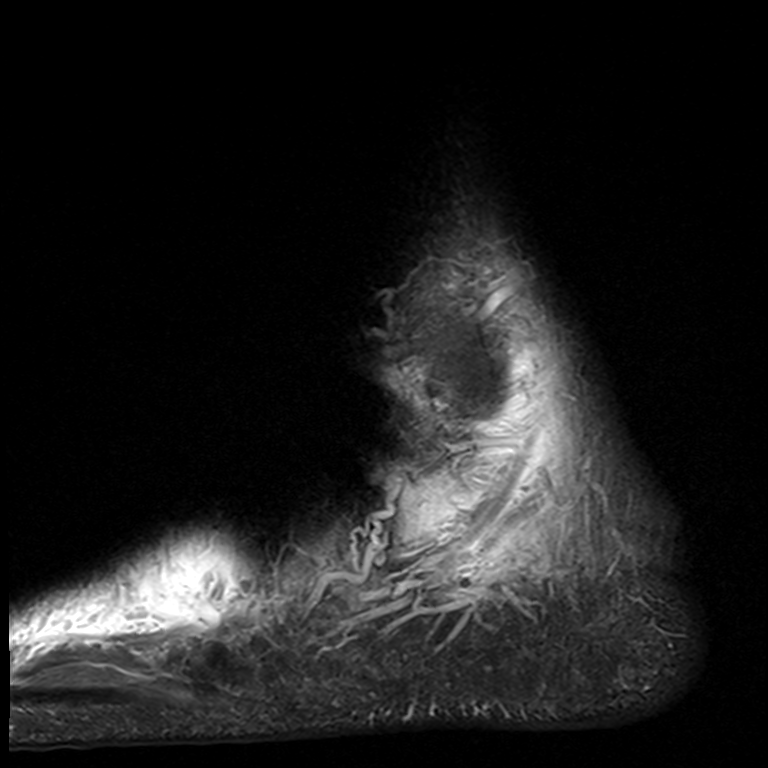
[im 18/32]
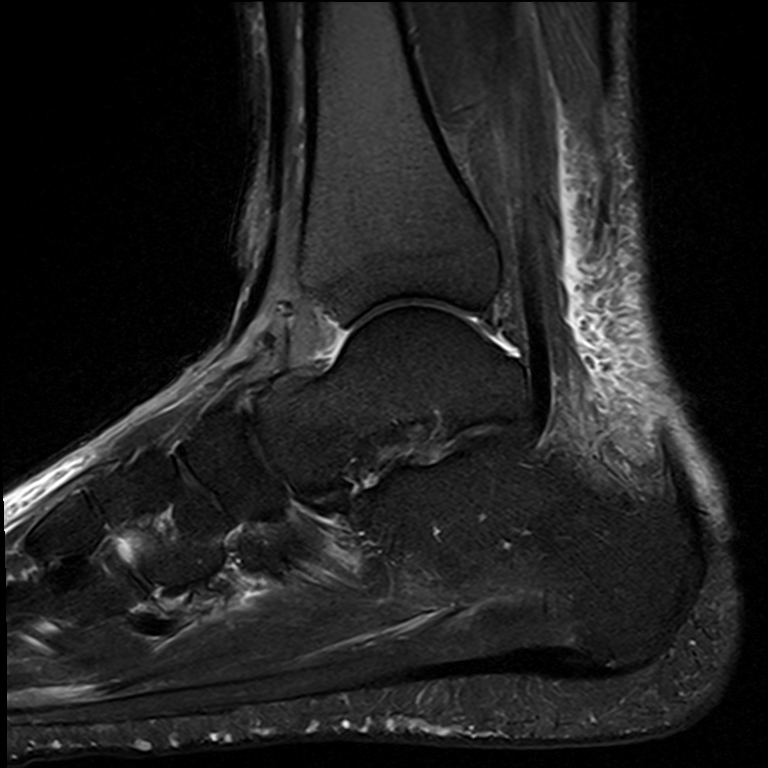

[8 of 40 positions shown; findings below may reference images not displayed]

FINDINGS: TENDONS

Peroneal: Unremarkable

Posteromedial: Tibialis posterior tendinopathy distally with mild
tenosynovitis.

Anterior: Fusiform thickening of the extensor digitorum longus
tendon just anterior to the talus.

Achilles: Mild distal Achilles tendinopathy with some faint linear
internal signal especially laterally. Edema infiltrates Kager's fat
pad. Trace fluid in the pre Achilles bursa. Low-level edema in the
plantar calcaneal spur as shown on image [DATE].

Plantar Fascia: Thickened medial band with low-level edema in the
plantar calcaneal spur, and subtle adjacent edema in the flexor
digitorum brevis and abducted digiti minimi muscles.

LIGAMENTS

Lateral: Unremarkable

Medial: Low-level edema signal along the deep tibiotalar fibers of
the deltoid ligament. Small size/ deficiency of the inferoplantar
longitudinal portion of the spring ligament.

CARTILAGE

Ankle Joint: A 1.0 by 0.4 cm osteochondral lesion along the medial
talar dome with focal T2 accentuation both in the articular
cartilage and in the subcortical region as shown on image [DATE]. Mild
irregularity of the cortex as shown on image [DATE].

Subtalar Joints/Sinus Tarsi: Unremarkable

Bones: No significant extra-articular osseous abnormalities
identified.

Other: Subcutaneous edema tracks along the lateral dorsum of the
foot and to a lesser extent along the medial foot. There is lateral,
posterior, and medial subcutaneous edema in the vicinity of the
ankle and lateral subcutaneous edema along the heel.
IMPRESSION: 1. Mild plantar fasciitis.
2. Osteochondral lesion of the medial talar dome with cortical and
subcortical edema and mild cortical irregularity. No local fragment
identified.
3. Tibialis posterior tendinopathy, correlate clinically in
assessing for tibialis posterior dysfunction.
4. Mild Achilles tendinopathy with some linear accentuated signal in
the Achilles tendon, edema in Kager' s fat pad, and a small amount
of edema in the Achilles spur of the calcaneus.
5. Deficiency of the inferoplantar longitudinal portion of the
spring ligament, may represent an old injury.
6. Low-level edema tracking along the deep tibiotalar component of
the deltoid ligament favoring sprain.
7. Subcutaneous edema along the ankle and dorsal foot is
nonspecific.

## 2019-03-21 ENCOUNTER — Ambulatory Visit: Payer: 59 | Attending: Pediatrics

## 2019-03-21 DIAGNOSIS — Z23 Encounter for immunization: Secondary | ICD-10-CM

## 2019-03-21 NOTE — Progress Notes (Signed)
   Covid-19 Vaccination Clinic  Name:  PASON BRADNEY    MRN: [id number] DOB: [birth year]  03/21/2019  Mr. Mccallum was observed post Covid-19 immunization for 15 minutes without incidence. He was provided with Vaccine Information Sheet and instruction to access the V-Safe system.   Mr. Thelin was instructed to call 911 with any severe reactions post vaccine: Marland Kitchen Difficulty breathing  . Swelling of your face and throat  . A fast heartbeat  . A bad rash all over your body  . Dizziness and weakness    Immunizations Administered    Name Date Dose VIS Date Route   Pfizer COVID-19 Vaccine 03/21/2019  6:35 PM 0.3 mL 02/08/2019 Intramuscular   Manufacturer: [place]   Lot: [id number]   [place]: [id number]

## 2019-04-11 ENCOUNTER — Ambulatory Visit: Payer: 59

## 2019-04-15 ENCOUNTER — Telehealth: Payer: Self-pay | Admitting: *Deleted

## 2019-04-15 NOTE — Telephone Encounter (Signed)
Attempted to reach pt x 2, busy signal each attempt. Pt scheduled for 2nd vaccine 04/11/19, 'No show.'  May need rescheduled.
